# Patient Record
Sex: Female | Born: 1970 | Race: Black or African American | Marital: Single | Smoking: Current some day smoker
Health system: Southern US, Community
[De-identification: ages and names within clinical notes are randomized; demographics above are authoritative.]

## PROBLEM LIST (undated history)

## (undated) DIAGNOSIS — R079 Chest pain, unspecified: Secondary | ICD-10-CM

## (undated) DIAGNOSIS — R0602 Shortness of breath: Secondary | ICD-10-CM

## (undated) DIAGNOSIS — G8929 Other chronic pain: Secondary | ICD-10-CM

## (undated) DIAGNOSIS — I10 Essential (primary) hypertension: Secondary | ICD-10-CM

## (undated) DIAGNOSIS — IMO0002 Reserved for concepts with insufficient information to code with codable children: Secondary | ICD-10-CM

## (undated) DIAGNOSIS — D649 Anemia, unspecified: Secondary | ICD-10-CM

## (undated) DIAGNOSIS — M549 Dorsalgia, unspecified: Secondary | ICD-10-CM

## (undated) DIAGNOSIS — R87619 Unspecified abnormal cytological findings in specimens from cervix uteri: Secondary | ICD-10-CM

## (undated) HISTORY — PX: FOOT SURGERY: SHX648

## (undated) HISTORY — DX: Chest pain, unspecified: R07.9

## (undated) HISTORY — PX: TUBAL LIGATION: SHX77

## (undated) HISTORY — DX: Other chronic pain: G89.29

## (undated) HISTORY — DX: Shortness of breath: R06.02

## (undated) HISTORY — DX: Essential (primary) hypertension: I10

## (undated) HISTORY — DX: Dorsalgia, unspecified: M54.9

---

## 2000-09-15 ENCOUNTER — Encounter: Payer: Self-pay | Admitting: Emergency Medicine

## 2000-09-15 ENCOUNTER — Emergency Department (HOSPITAL_COMMUNITY): Admission: EM | Admit: 2000-09-15 | Discharge: 2000-09-15 | Payer: Self-pay | Admitting: Emergency Medicine

## 2000-09-18 ENCOUNTER — Encounter: Payer: Self-pay | Admitting: Emergency Medicine

## 2000-09-18 ENCOUNTER — Inpatient Hospital Stay (HOSPITAL_COMMUNITY): Admission: EM | Admit: 2000-09-18 | Discharge: 2000-09-20 | Payer: Self-pay | Admitting: Emergency Medicine

## 2001-05-29 ENCOUNTER — Emergency Department (HOSPITAL_COMMUNITY): Admission: EM | Admit: 2001-05-29 | Discharge: 2001-05-29 | Payer: Self-pay

## 2002-01-01 ENCOUNTER — Inpatient Hospital Stay (HOSPITAL_COMMUNITY): Admission: AD | Admit: 2002-01-01 | Discharge: 2002-01-01 | Payer: Self-pay | Admitting: *Deleted

## 2002-03-21 ENCOUNTER — Ambulatory Visit (HOSPITAL_COMMUNITY): Admission: RE | Admit: 2002-03-21 | Discharge: 2002-03-21 | Payer: Self-pay | Admitting: Obstetrics and Gynecology

## 2002-04-15 ENCOUNTER — Inpatient Hospital Stay (HOSPITAL_COMMUNITY): Admission: AD | Admit: 2002-04-15 | Discharge: 2002-04-15 | Payer: Self-pay | Admitting: Obstetrics and Gynecology

## 2002-06-26 ENCOUNTER — Inpatient Hospital Stay (HOSPITAL_COMMUNITY): Admission: AD | Admit: 2002-06-26 | Discharge: 2002-06-29 | Payer: Self-pay | Admitting: *Deleted

## 2002-06-26 ENCOUNTER — Encounter (INDEPENDENT_AMBULATORY_CARE_PROVIDER_SITE_OTHER): Payer: Self-pay

## 2003-04-05 ENCOUNTER — Emergency Department (HOSPITAL_COMMUNITY): Admission: AD | Admit: 2003-04-05 | Discharge: 2003-04-05 | Payer: Self-pay

## 2003-07-16 ENCOUNTER — Emergency Department (HOSPITAL_COMMUNITY): Admission: EM | Admit: 2003-07-16 | Discharge: 2003-07-16 | Payer: Self-pay | Admitting: Emergency Medicine

## 2005-02-17 ENCOUNTER — Emergency Department (HOSPITAL_COMMUNITY): Admission: EM | Admit: 2005-02-17 | Discharge: 2005-02-17 | Payer: Self-pay | Admitting: Family Medicine

## 2005-02-17 ENCOUNTER — Inpatient Hospital Stay: Admission: AD | Admit: 2005-02-17 | Discharge: 2005-02-17 | Payer: Self-pay | Admitting: Family Medicine

## 2005-10-31 ENCOUNTER — Emergency Department (HOSPITAL_COMMUNITY): Admission: EM | Admit: 2005-10-31 | Discharge: 2005-10-31 | Payer: Self-pay | Admitting: Family Medicine

## 2006-01-21 ENCOUNTER — Emergency Department (HOSPITAL_COMMUNITY): Admission: EM | Admit: 2006-01-21 | Discharge: 2006-01-21 | Payer: Self-pay | Admitting: Pediatrics

## 2006-05-01 ENCOUNTER — Emergency Department (HOSPITAL_COMMUNITY): Admission: EM | Admit: 2006-05-01 | Discharge: 2006-05-01 | Payer: Self-pay | Admitting: Emergency Medicine

## 2006-05-26 ENCOUNTER — Emergency Department (HOSPITAL_COMMUNITY): Admission: EM | Admit: 2006-05-26 | Discharge: 2006-05-27 | Payer: Self-pay | Admitting: Emergency Medicine

## 2008-06-10 ENCOUNTER — Encounter (INDEPENDENT_AMBULATORY_CARE_PROVIDER_SITE_OTHER): Payer: Self-pay | Admitting: Nurse Practitioner

## 2009-09-23 ENCOUNTER — Emergency Department (HOSPITAL_COMMUNITY): Admission: EM | Admit: 2009-09-23 | Discharge: 2009-09-23 | Payer: Self-pay | Admitting: Emergency Medicine

## 2010-07-14 ENCOUNTER — Ambulatory Visit: Payer: Self-pay | Admitting: Gynecology

## 2010-07-14 ENCOUNTER — Inpatient Hospital Stay (HOSPITAL_COMMUNITY): Admission: AD | Admit: 2010-07-14 | Discharge: 2010-07-14 | Payer: Self-pay | Admitting: Obstetrics and Gynecology

## 2010-09-29 ENCOUNTER — Ambulatory Visit: Payer: Self-pay | Admitting: Obstetrics and Gynecology

## 2011-01-06 LAB — GC/CHLAMYDIA PROBE AMP, GENITAL: GC Probe Amp, Genital: NEGATIVE

## 2011-01-06 LAB — URINALYSIS, ROUTINE W REFLEX MICROSCOPIC
Glucose, UA: NEGATIVE mg/dL
Specific Gravity, Urine: 1.02 (ref 1.005–1.030)
pH: 6 (ref 5.0–8.0)

## 2011-01-06 LAB — CBC
Hemoglobin: 12.9 g/dL (ref 12.0–15.0)
MCH: 31.9 pg (ref 26.0–34.0)
MCHC: 33.6 g/dL (ref 30.0–36.0)
Platelets: 261 10*3/uL (ref 150–400)
RDW: 13.9 % (ref 11.5–15.5)

## 2011-01-06 LAB — URINE MICROSCOPIC-ADD ON

## 2011-01-06 LAB — POCT PREGNANCY, URINE: Preg Test, Ur: NEGATIVE

## 2011-01-06 LAB — WET PREP, GENITAL: Clue Cells Wet Prep HPF POC: NONE SEEN

## 2011-03-11 NOTE — H&P (Signed)
NAME:  Jamie Klein, Jamie Klein                          ACCOUNT NO.:  [id number]   MEDICAL RECORD NO.:  [id number]                   PATIENT TYPE:  INP   LOCATION:  NA                                   FACILITY:  [facility]   PHYSICIAN:  Reva Bores, MD                   DATE OF BIRTH:  1971-06-04   DATE OF ADMISSION:  06/26/2002  DATE OF DISCHARGE:                                HISTORY & PHYSICAL   IDENTIFYING INFORMATION:  This is a History and Physical dictated for an  operation.  She is scheduled for a repeat C-section with BTL on June 26, 2002.   CHIEF COMPLAINT:  Intrauterine pregnancy at 39 weeks and previous C-  sections.   HISTORY OF PRESENT ILLNESS:  The patient is a 40 year old gravida 7, para 3-  0-3-2 who is at 39-0/7 weeks on the day of surgery who is dated by a 25-week  ultrasound that agreed with her LMP.  She has a history of previous C-  section x2 and drug use.   PAST MEDICAL HISTORY:  Significant for history of addiction since age 32  years.  She has had problems with crack cocaine, alcohol as well as tobacco  use.   PAST SURGICAL HISTORY:  She has had prior cesarean section x2 as well as  wisdom teeth extraction.   PAST GYNECOLOGICAL HISTORY:  The patient has a history of Trichomonas and  candidiasis.  She has a history of an abnormal Pap in 1989 and none since  then.   PAST OBSTETRICAL HISTORY:  She has a history of a vaginal delivery of a 6  pound 1 ounce female without complications in 1989.  In 1991, another female  7 pounds 3 ounces delivered by C-section for fetal distress.  This baby is  deceased.  In 12-22-91, she had a female 5 pound 3 ounce delivered by  C-section and three previous therapeutic ABs.  Her current pregnancy has  been uncomplicated thus far.   PERTINENT LABORATORY AND X-RAY DATA:  Include initial hemoglobin of 10.9,  platelet count of 299.  Her blood type is 0 positive.  Her antibody screen  is negative.  Her RPR is nonreactive.   Hepatitis B surface antigen is  negative.  She is rubella immune.  She had a normal hemoglobin  electrophoresis.  Her HIV is negative.  Gonorrhea and Chlamydia cultures are  also negative.  Her Pap smear is negative.  Her one-hour was 107 and her  Group B strep was positive.   PHYSICAL EXAMINATION:  GENERAL:  The patient is a well-developed, well-  nourished gravid female in no acute distress.  HEENT:  Normocephalic, atraumatic.  Pupils equal, round, reactive to light  and accommodation.  Extraocular muscles intact.  She has poor dentition.  NECK:  Supple with normal thyroid.  BREASTS:  Normal with inverted nipples and without mass.  CARDIOVASCULAR:  Regular rate and rhythm with a 2/6 systolic ejection murmur  heard.  LUNGS:  Clear bilaterally.  ABDOMEN:  Gravid and appropriately grown fetus.  Nontender.  EXTREMITIES:  Without clubbing, cyanosis, or edema.  PELVIC:  Vaginal exam is not being performed at this time.    IMPRESSION:  52. A 40 year old G7, P3-0-3-2 at 39-0/7 weeks.  2. History of drug abuse currently in drug rehab.  3. Group B strep postive.  4. Previous C-sections x2.  5. Undesired fertility.   PLAN:  Repeat low transverse cesarean section and BTL.   DISCUSSION:  The patient and husband counseled about risks and benefits of  repeat C-section versus trial of labor as well as the permanency of  sterilization including risk of pregnancy, risk of ectopic permanency of  procedure, and inability to reverse this procedure. The patient understands  all of these risks and agrees to proceed with sterilization.  She is also  advised about the risks of repeat C-section including risks of bleeding,  infection, maternal death, injury to bowel and bladder or surrounding  structures.  The patient understands all of these risks and agrees to  proceed with a repeat C-section.                                               Reva Bores, MD    TSP/MEDQ  D:  06/14/2002  T:   06/14/2002  Job:  [id number]

## 2011-03-11 NOTE — Discharge Summary (Signed)
[place]. [facility]  Patient:    Jamie Klein, Jamie Klein                       MRN: [id number] Adm. Date:  [id number] Disc. Date: 09/20/00 Attending:  Effie Berkshire                           Discharge Summary  ADMISSION DIAGNOSIS:  Cellulitis of left parapharyngeal and left submandibular space secondary to abscess tooth number 17.  DISCHARGE DIAGNOSIS: Cellulitis of left parapharyngeal and left submandibular space secondary to abscess tooth number 17.  BRIEF HISTORY:  This reveals a 40 year old black female who had reported early on Monday morning to the emergency room at [facility] suffering with swelling and pain to the left side of her neck.  By history, the patient relates that she had experienced swelling approximately five to six days previously and in the last three days had been unable to eat any solid foods. At the time of her initial examination, she demonstrated moderate swelling of the left submandibular region and changes to the character of her voice.  Oral examination revealed severe trismus, but I could identify that the left parapharyngeal space was very swollen.  There was deviation of the uvula to the right side, and gentle palpation over partially erupted tooth number 17 elicited extreme discomfort.  The patient stated that tooth number 17 had been painful to her for a few days, and you could see obvious decay in the occlusal surface of this tooth.  At this time, the patient was admitted for treatment with intravenous antibiotics and surgical incision and drainage along with the removal of tooth number 17.  PERTINENT LABORATORY DATA ON ADMISSION:  WBC 20.8, RBC 4.16, hemoglobin 12.9, hematocrit 36.6.  Differential revealed neutrophils of 86 and neutrophil absolute of 17.9, lymphocytes 7, monocytes 1.0.  It was also noted that her glucose was slightly elevated at 128.  A urinalysis revealed a cloudy appearance with a specific  gravity of 1.04.  There were traces of hemoglobin and small amount of bilirubin seen in this urinalysis, and it was also positive for nitrite.  Many bacteria cells were also seen, and WBC casts were also noted.  At the time of admission, a chest x-ray was obtained which revealed the heart size and mediastinal contours were unremarkable, and the lungs were clear.  The Panorex radiograph revealed large caries seen involving the right mandibular first molar, but there was no evidence of periapical abscess.  In my review of this x-ray, I could also demonstrate some caries of partially erupted tooth number 17 and also a slight radioluscent change at the apex of tooth number 17.  In addition a neck and head and neck CT were obtained, and there was seen a large complex lesion containing internal gas seen in the left parapharyngeal soft tissues which extends from the level of the skull base to the angle of the mandible.  The submandibular gland is displaced inferiorly by the left parapharyngeal abscess and associated soft tissue swelling.  Adenopathy is seen along the left carotid and jugular chains with the largest node measuring approximately 1.2 to 2.0 cm in the jugular digastric area.  HOSPITAL COURSE:  At the time of admission, the patients temperature was 101, pulse 95 and regular, respirations 18 and unlabored, blood pressure 110/41.  Surgical treatment for this patient consisted of incision and drainage of the left  submandibular and left parapharyngeal spaces along with the surgical removal of tooth number 17.  This procedure was completed on the evening of September 18, 2000, under general anesthesia.  At the time of incision and drainage of the left parapharyngeal space, significant amounts of purulence were obtained, and this was sent for culture and sensitivity.  At the time of discharge, the results received thus far indicated from the Gram smear, moderate WBC present  predominantly PMNs, moderate gram-positive cocci.  Later, the anaerobic culture revealed some gram-negative rods and a few gram-positive rods.  At the time of discharge, the patient was maintaining a full liquid diet without difficulty and had no problems with respiration.  Her discharge condition was ambulatory and satisfactory.  It was noted that she still has some swelling at the left submandibular and left parapharyngeal spaces.  I informed her that it may take as much as two weeks before this swelling is completely resolved.  DISCHARGE INSTRUCTIONS AND DISCHARGE MEDICATIONS: 1. Activity as tolerated. 2. Full liquid diet. 3. a. Prescription for Augmentin 875 mg to be taken on a b.i.d. basis for       a period of 10 days.    b. Prescription for Lortab liquid to be taken 1 tablespoon every 3 to       4 hours as needed for discomfort.    c. Prescription for Perigard mouth rinse to use to swish 1/2 ounce for       30 seconds and then expectorate twice a day.  Discharge instructions were given to the patient, and she is to call my office for postoperative appointment on December 3 or December 4.DD:  09/20/00 TD:  09/20/00 Job: [id number]

## 2011-03-11 NOTE — Op Note (Signed)
[place]. [facility]  Patient:    Jamie Klein, Jamie Klein                       MRN: [id number] Proc. Date: 09/18/00 Adm. Date:  [id number] Attending:  Effie Berkshire                           Operative Report  PREOPERATIVE DIAGNOSIS:  Facial cellulitis of the left submandibular and left parapharyngeal spaces secondary to infected tooth number 17.  POSTOPERATIVE DIAGNOSIS:  Facial cellulitis of the left submandibular and left parapharyngeal spaces secondary to infected tooth number 17.  SURGEON:  Saddie Benders, D.D.S.  SURGICAL PROCEDURE:  Surgical removal of impacted tooth number 17 and extraoral incision and drainage of the left submandibular and left parapharyngeal spaces.  DETAILS OF SURGICAL PROCEDURE:  On the evening of September 18, 2000, this patient was brought to the operating room, where after obtaining the proper level of anesthesia with the use of orotracheal intubation, the patient received an intraoral and extraoral Betadine prep.  A 2 inch wet vaginal gauze packing was placed in the oropharynx and sterile drapes were used to isolate the surgical field.  First attention was directed to impacted tooth number 17, where a total of 1.8 cc of 0.5% Marcaine with 1:200,000 epinephrine were utilized for local anesthesia.  A #15-blade was used to make a mucoperiosteal flap overlying the impacted tooth and a Stryker drill with a #8 round bur was used to remove some of the buccal cortical plate.  This tooth was then extracted from its respective socket site.  The bony socket was smoothed with a rongeur and a bone file.  The area was thoroughly irrigated and suctioned and tissue reapproximated with #4-0 chromic suture in an interrupted fashion. Attention was now directed to the left submandibular region, where a #15-blade was used to make an incision approximately 2.5 cm long approximately 1.5 to 2 fingers width below the left angle of the  mandible.  This incision was carried through the skin and subcutaneous tissues.  At this point, a large curved hemostat was then used to begin blunt dissection down to the left inferior border of the mandible just anterior to the angle.  While placing one finger inside the mouth in the left parapharyngeal space, this blunt dissection was carried out medial to the mandible and once it dropped into the left parapharyngeal space, copious amounts of pus was obtained.  This was sent for culture and sensitivity for aerobic, anaerobic and Gram stain.  The hemostat was explored around in this space to continue drainage as much as possible. Then, the hemostat was redirected more anterior and medial into the left submandibular space.  Again, I was able to palpate the space with my left hand to determine that the hemostat was in the correct position.  No actual pus was encountered in the left submandibular space.  At this time, a 1/4 inch Penrose drain was placed into the left submandibular space and the second one through the same incision and drainage site was placed into the left parapharyngeal space.  Both of these drains were then secured with one #4-0 black silk suture.   The 2 inch wet vaginal gauze packing was removed from the oropharynx and this patient was then transferred to the recovery room in stable condition.  TIME OF PROCEDURE:  45 minutes.  ESTIMATED BLOOD LOSS:  Minimal.  ANESTHESIA:  General.  COMPLICATIONS:  Without.  CONDITION OF PATIENT WHEN LAST EXAMINED:  Deemed satisfactory. DD:  09/18/00 TD:  09/18/00 Job: [id number]

## 2011-03-11 NOTE — Discharge Summary (Signed)
NAME:  Jamie Klein                             ACCOUNT NO.:  [id number]   MEDICAL RECORD NO.:  [id number]                   PATIENT TYPE:   LOCATION:                                       FACILITY:   PHYSICIAN:  Caren Griffins, C.N.M.                 DATE OF BIRTH:   DATE OF ADMISSION:  06/26/2002  DATE OF DISCHARGE:  06/29/2002                                 DISCHARGE SUMMARY   ADMISSION DIAGNOSES:  1. Term intrauterine pregnancy.  2. Desirous of repeat cesarean section.  3. Multiparity, desirous of tubal ligation.   DISCHARGE DIAGNOSES:  1. Cesarean delivery of a viable female at term.  2. Bilateral tubal ligation.   HISTORY:  This is a 40 year old G7 P3-0-3-2 who presented at 41 and 0  sevenths scheduled for surgery without labor.  She had received counseling  about trial of labor and declined it.  Her first cesarean was due to fetal  distress and her second was a failed trial of labor; both were ATA.  Also of  note she has a significant substance abuse history.  Her dating was good.  She had a 25-week ultrasound confirming an LMP that she was sure of.  Pregnancy course was essentially uncomplicated.  She started care late.  Her  glucose screen was 107.  She was O positive, antibody screen negative,  rubella immune, and had no STIs.   ALLERGIES:  None.   MEDICATIONS:  Prenatal vitamins.   GYNECOLOGICAL HISTORY:  Significant for trichomonas and candidiasis pre-  pregnancy.  She had an abnormal Pap smear in 1989 and had not had another  Pap smear until May 2003 which was done at her new OB visit and was  negative.   OBSTETRICAL HISTORY:  As above.  Of note, she also had three therapeutic ABs  that were all early and uncomplicated.   PERTINENT PRENATAL LABORATORY DATA:  Hemoglobin 10.9, platelets 299.  GC and  chlamydia were negative.  GBS was positive.   FAMILY HISTORY:  Noncontributory.   SOCIAL HISTORY:  She has had significant addiction since the age of 73  including problems with crack cocaine, alcohol, and tobacco use - smoking  about a pack a day during this pregnancy.  UDS was not done on admission.   ADMISSION PHYSICAL EXAMINATION:  GENERAL:  She is in no distress.  VITAL SIGNS:  Blood pressure was normal; she was afebrile.  HEENT:  WNL.  NECK:  Supple without thyroid enlargement.  BREASTS:  Normal, no masses.  CARDIOVASCULAR:  RRR with a grade 2/6 systolic ejection murmur.  LUNGS:  Clear bilaterally.  ABDOMEN:  Term-sized, gravid, with no tenderness.  EXTREMITIES:  Without edema.  PELVIC:  Deferred.   HOSPITAL COURSE:  The patient was admitted to the OR for a scheduled repeat  low transverse cesarean section with BTL.  She was counseled on risks and  benefits and  trial of labor again and the permanency of sterilization and  understood all of this.  The surgeon was Dr. Orlene Erm and Dr. Shawnie Pons.  There  were no operative complications.  Bilateral tube segments were sent to  pathology.  EBL was 800 cc.  Her postoperative course was essentially  uncomplicated.  She apparently was seen by the social worker during her  hospitalization.  It was found that she was going to be placed for her  living arrangement at [facility], which is a two-year recovery program.  Of note, the baby's urine drug screen was negative.  CPS is involved.  She  does not have custody of her other children.  Physically, she recovered  normally postpartum.  On day #3 she was ambulatory without orthostatic  symptoms, voiding QS, tolerating a regular diet, breast feeding, and had no  complaints other than after pains.  She is afebrile, vital signs stable.  Hemoglobin on discharge was 10.1, hematocrit 29.4.  Abdomen was minimally  tender and uterus was involuting.  Incision CDI with staples.   MEDICATIONS:  She was discharged to [facility] on the following  medications:  1. Ibuprofen 600 p.o. q.6h.  2. Prenatal vitamins one a day.  3. Iron supplement one a day.    DISPOSITION:  She was to return in two days to have staples removed and  otherwise in six weeks to [facility] for postpartum visit.  She was  given the routine instructions and was in good condition on discharge.                                               Caren Griffins, C.N.M.    DP/MEDQ  D:  09/10/2002  T:  09/10/2002  Job:  [id number]

## 2011-05-12 ENCOUNTER — Inpatient Hospital Stay (HOSPITAL_COMMUNITY)
Admission: AD | Admit: 2011-05-12 | Discharge: 2011-05-12 | Disposition: A | Discharge status: Home / Self Care | Payer: Medicaid Other | Source: Ambulatory Visit | Attending: Family Medicine | Admitting: Family Medicine

## 2011-05-12 ENCOUNTER — Encounter (HOSPITAL_COMMUNITY): Payer: Self-pay | Admitting: Advanced Practice Midwife

## 2011-05-12 DIAGNOSIS — N390 Urinary tract infection, site not specified: Secondary | ICD-10-CM | POA: Insufficient documentation

## 2011-05-12 DIAGNOSIS — N920 Excessive and frequent menstruation with regular cycle: Secondary | ICD-10-CM

## 2011-05-12 DIAGNOSIS — F1911 Other psychoactive substance abuse, in remission: Secondary | ICD-10-CM | POA: Insufficient documentation

## 2011-05-12 DIAGNOSIS — Z98891 History of uterine scar from previous surgery: Secondary | ICD-10-CM | POA: Insufficient documentation

## 2011-05-12 DIAGNOSIS — Z8619 Personal history of other infectious and parasitic diseases: Secondary | ICD-10-CM | POA: Insufficient documentation

## 2011-05-12 DIAGNOSIS — J020 Streptococcal pharyngitis: Secondary | ICD-10-CM

## 2011-05-12 HISTORY — DX: Reserved for concepts with insufficient information to code with codable children: IMO0002

## 2011-05-12 HISTORY — DX: Anemia, unspecified: D64.9

## 2011-05-12 HISTORY — DX: Unspecified abnormal cytological findings in specimens from cervix uteri: R87.619

## 2011-05-12 LAB — URINALYSIS, ROUTINE W REFLEX MICROSCOPIC
Glucose, UA: NEGATIVE mg/dL
Leukocytes, UA: NEGATIVE
Protein, ur: NEGATIVE mg/dL
Specific Gravity, Urine: >1.03 — ABNORMAL HIGH (ref 1.005–1.030)

## 2011-05-12 LAB — WET PREP, GENITAL
Trich, Wet Prep: NONE SEEN
Yeast Wet Prep HPF POC: NONE SEEN

## 2011-05-12 LAB — DIFFERENTIAL
Basophils Absolute: 0 10*3/uL (ref 0.0–0.1)
Lymphocytes Relative: 21 % (ref 12–46)
Monocytes Absolute: 1 10*3/uL (ref 0.1–1.0)
Neutro Abs: 8.5 10*3/uL — ABNORMAL HIGH (ref 1.7–7.7)

## 2011-05-12 LAB — CBC
HCT: 34.6 % — ABNORMAL LOW (ref 36.0–46.0)
Platelets: 239 10*3/uL (ref 150–400)
RBC: 3.9 MIL/uL (ref 3.87–5.11)
RDW: 13.9 % (ref 11.5–15.5)
WBC: 12 10*3/uL — ABNORMAL HIGH (ref 4.0–10.5)

## 2011-05-12 LAB — POCT PREGNANCY, URINE
Preg Test, Ur: NEGATIVE
Preg Test, Ur: NEGATIVE

## 2011-05-12 LAB — URINE MICROSCOPIC-ADD ON

## 2011-05-12 MED ORDER — CEFTRIAXONE SODIUM 1 G IJ SOLR
1.0000 g | Freq: Once | INTRAMUSCULAR | Status: AC
  Administered 2011-05-12: 1 g via INTRAMUSCULAR
  Filled 2011-05-12: qty 1

## 2011-05-12 MED ORDER — AMOXICILLIN 875 MG PO TABS: 875.0000 mg | ORAL_TABLET | Freq: Two times a day (BID) | ORAL | Status: AC

## 2011-05-12 MED ORDER — ACETAMINOPHEN 500 MG PO TABS
1000.0000 mg | ORAL_TABLET | Freq: Once | ORAL | Status: AC
  Administered 2011-05-12: 1000 mg via ORAL
  Filled 2011-05-12: qty 2

## 2011-05-12 NOTE — Progress Notes (Signed)
Vaginal bleeding since 07/08 gets heavy and slacks off, tubal ligation

## 2011-05-12 NOTE — ED Provider Notes (Addendum)
History    The pt is a 40 year-old female who presents to [facility]U reporting vaginal bleeding since 05/01/11 as heavy as the middle of a period. The denies any previous episodes of prolonged bleeding. She is a poor historian and does not know when her last Pap was. Last IC several months ago. She also reports having chills, mod sore throat, mild cough and mild nausea since this morning. She denies UTI Sx. Chief Complaint  Patient presents with  . Vaginal Bleeding  . Sore Throat    onset today   HPI  Pertinent Gynecological History: Menses: flow is moderate, regular every month without intermenstrual spotting and usually lasting less than 6 days Bleeding: dysfunctional uterine bleeding Contraception: tubal ligation Sexually transmitted diseases: past history: Trich Previous GYN Procedures: unknown  Last mammogram: unknown  Last pap: unknown Date: unknown     Past Medical History  Diagnosis Date  . Abnormal Pap smear   . Anemia    . Past Surgical History  Procedure Date  . Cesarean section     x3  . Tubal ligation     No family history on file.  History  Substance Use Topics  . Smoking status: Not on file  . Smokeless tobacco: Not on file  . Alcohol Use:     Allergies: No Known Allergies  Prescriptions prior to admission  Medication Sig Dispense Refill  . traMADol (ULTRAM) 50 MG tablet Take 50 mg by mouth every 6 (six) hours as needed. For pain         Review of Systems  Constitutional: Positive for chills.  HENT: Positive for congestion and sore throat. Negative for ear pain.   Respiratory: Positive for cough (mild cough x 1 day).   Gastrointestinal: Positive for nausea. Negative for vomiting, abdominal pain, diarrhea and constipation.  Genitourinary: Negative for dysuria, urgency, frequency, hematuria and flank pain.  Musculoskeletal: Negative for myalgias.   Physical Exam   Blood pressure 100/57, pulse 96, temperature 100.6 F (38.1 C), temperature source Oral,  resp. rate 18, height 5\' 3"  (1.6 m), weight 66.679 kg (147 lb), last menstrual period 05/01/2011.  Physical Exam  Constitutional: She appears well-developed and well-nourished. No distress.  HENT:  Mouth/Throat: Oropharynx is clear and moist.  Cardiovascular: Regular rhythm.  Tachycardia present.   Respiratory: Effort normal and breath sounds normal.  GI: Soft. Bowel sounds are normal. There is no tenderness. There is no CVA tenderness.  Genitourinary: Uterus normal. Cervix exhibits no motion tenderness and no friability. Right adnexum displays no mass, no tenderness and no fullness. Left adnexum displays no mass, no tenderness and no fullness. There is bleeding (small amount of DRB, normal odor) around the vagina.   Results for orders placed during the hospital encounter of 05/12/11 (from the past 24 hour(s))  RAPID STREP SCREEN     Status: Abnormal   Collection Time   05/12/11  6:00 PM      Component Value Range   Streptococcus, Group A Screen (Direct) POSITIVE (*) NEGATIVE   URINALYSIS, ROUTINE W REFLEX MICROSCOPIC     Status: Abnormal   Collection Time   05/12/11  6:10 PM      Component Value Range   Color, Urine YELLOW  YELLOW    Appearance CLEAR  CLEAR    Specific Gravity, Urine >1.030 (*) 1.005 - 1.030    pH 5.5  5.0 - 8.0    Glucose, UA NEGATIVE  NEGATIVE (mg/dL)   Hgb urine dipstick LARGE (*) NEGATIVE  Bilirubin Urine NEGATIVE  NEGATIVE    Ketones, ur 15 (*) NEGATIVE (mg/dL)   Protein, ur NEGATIVE  NEGATIVE (mg/dL)   Urobilinogen, UA 0.2  0.0 - 1.0 (mg/dL)   Nitrite POSITIVE (*) NEGATIVE    Leukocytes, UA NEGATIVE  NEGATIVE   URINE MICROSCOPIC-ADD ON     Status: Abnormal   Collection Time   05/12/11  6:10 PM      Component Value Range   Squamous Epithelial / LPF MANY (*) RARE    WBC, UA 3-6  <3 (WBC/hpf)   RBC / HPF 0-2  <3 (RBC/hpf)   Bacteria, UA MANY (*) RARE    Urine-Other MUCOUS PRESENT    POCT PREGNANCY, URINE     Status: Normal   Collection Time   05/12/11   6:13 PM      Component Value Range   Preg Test, Ur NEGATIVE    WET PREP, GENITAL     Status: Abnormal   Collection Time   05/12/11  6:15 PM      Component Value Range   Yeast, Wet Prep NONE SEEN  NONE SEEN    Trich, Wet Prep NONE SEEN  NONE SEEN    Clue Cells, Wet Prep NONE SEEN  NONE SEEN    WBC, Wet Prep HPF POC FEW (*) NONE SEEN   CBC     Status: Abnormal   Collection Time   05/12/11  7:46 PM      Component Value Range   WBC 12.0 (*) 4.0 - 10.5 (K/uL)   RBC 3.90  3.87 - 5.11 (MIL/uL)   Hemoglobin 11.6 (*) 12.0 - 15.0 (g/dL)   HCT 16.1 (*) 09.6 - 46.0 (%)   MCV 88.7  78.0 - 100.0 (fL)   MCH 29.7  26.0 - 34.0 (pg)   MCHC 33.5  30.0 - 36.0 (g/dL)   RDW 04.5  40.9 - 81.1 (%)   Platelets 239  150 - 400 (K/uL)  DIFFERENTIAL     Status: Abnormal   Collection Time   05/12/11  7:46 PM      Component Value Range   Neutrophils Relative 70  43 - 77 (%)   Neutro Abs 8.5 (*) 1.7 - 7.7 (K/uL)   Lymphocytes Relative 21  12 - 46 (%)   Lymphs Abs 2.5  0.7 - 4.0 (K/uL)   Monocytes Relative 8  3 - 12 (%)   Monocytes Absolute 1.0  0.1 - 1.0 (K/uL)   Eosinophils Relative 0  0 - 5 (%)   Eosinophils Absolute 0.0  0.0 - 0.7 (K/uL)   Basophils Relative 0  0 - 1 (%)   Basophils Absolute 0.0  0.0 - 0.1 (K/uL)  POCT PREGNANCY, URINE     Status: Normal   Collection Time   05/12/11  8:01 PM      Component Value Range   Preg Test, Ur NEGATIVE      MAU Course  Procedures  Assessment and Plan  Assessment: 1. Strep throat 2. UTI w// no evidence of pyelonephritis 3. Menorrhagia, stable  Plan: 1. Rocephin 1g IM x 1 now, then amoxicillin 875 PO BID x 7 days 2. Urine culture 3. Pyelo precautions 4. Pap ASAP 5. F/U  HD for menorrhagia or MAU for heavy bleeding or dizziness   SMITH,[place] 05/12/2011, 8:32 PM    Urine culture showed >100,000 e. Coli, pansensitive except to ampicillin, pt treated with rochephin at MAU visit, no further tx required  Azavion Bouillon 8:11 AM 05/17/11

## 2011-05-13 LAB — GC/CHLAMYDIA PROBE AMP, GENITAL: GC Probe Amp, Genital: NEGATIVE

## 2011-05-15 LAB — URINE CULTURE

## 2011-05-17 NOTE — Progress Notes (Signed)
Encounter addended by: Georges Mouse, CNM on: 05/17/2011  8:11 AM<BR>     Documentation filed: Charting, Inpatient Notes

## 2011-05-23 NOTE — ED Provider Notes (Signed)
Agree with above note.  Jamie Klein 05/23/2011 2:16 PM

## 2011-08-01 ENCOUNTER — Other Ambulatory Visit (HOSPITAL_COMMUNITY): Payer: Self-pay | Admitting: Internal Medicine

## 2011-08-01 DIAGNOSIS — Z1231 Encounter for screening mammogram for malignant neoplasm of breast: Secondary | ICD-10-CM

## 2011-08-12 ENCOUNTER — Ambulatory Visit (HOSPITAL_COMMUNITY): Payer: Medicaid Other

## 2011-09-02 ENCOUNTER — Ambulatory Visit (HOSPITAL_COMMUNITY)
Admission: RE | Admit: 2011-09-02 | Discharge: 2011-09-02 | Disposition: A | Discharge status: Home / Self Care | Payer: Medicaid Other | Source: Ambulatory Visit | Attending: Internal Medicine | Admitting: Internal Medicine

## 2011-09-02 DIAGNOSIS — Z1231 Encounter for screening mammogram for malignant neoplasm of breast: Secondary | ICD-10-CM | POA: Insufficient documentation

## 2012-08-22 ENCOUNTER — Ambulatory Visit
Admission: RE | Admit: 2012-08-22 | Discharge: 2012-08-22 | Disposition: A | Discharge status: Home / Self Care | Payer: Medicaid Other | Source: Ambulatory Visit | Attending: Specialist | Admitting: Specialist

## 2012-08-22 ENCOUNTER — Other Ambulatory Visit: Payer: Self-pay | Admitting: Specialist

## 2012-08-22 DIAGNOSIS — M549 Dorsalgia, unspecified: Secondary | ICD-10-CM

## 2012-09-12 ENCOUNTER — Other Ambulatory Visit (HOSPITAL_COMMUNITY): Payer: Self-pay | Admitting: Specialist

## 2012-09-12 DIAGNOSIS — Z1231 Encounter for screening mammogram for malignant neoplasm of breast: Secondary | ICD-10-CM

## 2012-10-03 ENCOUNTER — Ambulatory Visit (HOSPITAL_COMMUNITY): Payer: Medicaid Other

## 2012-10-08 ENCOUNTER — Other Ambulatory Visit (HOSPITAL_COMMUNITY): Payer: Self-pay | Admitting: Cardiovascular Disease

## 2012-10-08 DIAGNOSIS — R072 Precordial pain: Secondary | ICD-10-CM

## 2012-10-19 ENCOUNTER — Ambulatory Visit (HOSPITAL_COMMUNITY): Payer: Medicaid Other

## 2012-10-23 ENCOUNTER — Ambulatory Visit (HOSPITAL_COMMUNITY): Payer: Medicaid Other

## 2012-10-25 ENCOUNTER — Encounter (HOSPITAL_COMMUNITY): Payer: Medicaid Other

## 2012-11-14 ENCOUNTER — Ambulatory Visit (HOSPITAL_COMMUNITY): Payer: Medicaid Other

## 2012-11-15 ENCOUNTER — Ambulatory Visit (HOSPITAL_COMMUNITY)
Admission: RE | Admit: 2012-11-15 | Discharge: 2012-11-15 | Disposition: A | Discharge status: Home / Self Care | Payer: Medicaid Other | Source: Ambulatory Visit | Attending: Cardiovascular Disease | Admitting: Cardiovascular Disease

## 2012-11-15 DIAGNOSIS — F17200 Nicotine dependence, unspecified, uncomplicated: Secondary | ICD-10-CM | POA: Insufficient documentation

## 2012-11-15 DIAGNOSIS — R072 Precordial pain: Secondary | ICD-10-CM

## 2012-11-15 DIAGNOSIS — R079 Chest pain, unspecified: Secondary | ICD-10-CM

## 2012-11-15 DIAGNOSIS — I059 Rheumatic mitral valve disease, unspecified: Secondary | ICD-10-CM | POA: Insufficient documentation

## 2012-11-15 DIAGNOSIS — I369 Nonrheumatic tricuspid valve disorder, unspecified: Secondary | ICD-10-CM | POA: Insufficient documentation

## 2012-11-15 HISTORY — DX: Chest pain, unspecified: R07.9

## 2012-11-15 NOTE — Progress Notes (Signed)
[facility]   2D echo completed 11/15/2012.   Cindy Dorell Gatlin, RDCS   

## 2012-11-23 ENCOUNTER — Ambulatory Visit (HOSPITAL_COMMUNITY)
Admission: RE | Admit: 2012-11-23 | Discharge: 2012-11-23 | Disposition: A | Discharge status: Home / Self Care | Payer: Medicaid Other | Source: Ambulatory Visit | Attending: Specialist | Admitting: Specialist

## 2012-11-23 DIAGNOSIS — Z1231 Encounter for screening mammogram for malignant neoplasm of breast: Secondary | ICD-10-CM | POA: Insufficient documentation

## 2012-11-27 ENCOUNTER — Other Ambulatory Visit: Payer: Self-pay | Admitting: Specialist

## 2012-11-27 DIAGNOSIS — R928 Other abnormal and inconclusive findings on diagnostic imaging of breast: Secondary | ICD-10-CM

## 2012-12-07 ENCOUNTER — Other Ambulatory Visit: Payer: Medicaid Other

## 2012-12-12 ENCOUNTER — Other Ambulatory Visit: Payer: Self-pay | Admitting: Internal Medicine

## 2012-12-12 ENCOUNTER — Ambulatory Visit
Admission: RE | Admit: 2012-12-12 | Discharge: 2012-12-12 | Disposition: A | Discharge status: Home / Self Care | Payer: Medicaid Other | Source: Ambulatory Visit | Attending: Specialist | Admitting: Specialist

## 2012-12-12 DIAGNOSIS — R928 Other abnormal and inconclusive findings on diagnostic imaging of breast: Secondary | ICD-10-CM

## 2013-02-23 ENCOUNTER — Encounter: Payer: Self-pay | Admitting: Cardiovascular Disease

## 2013-03-13 ENCOUNTER — Ambulatory Visit: Payer: Medicaid Other | Admitting: Cardiovascular Disease

## 2013-03-22 NOTE — Progress Notes (Signed)
Patient has appointment to discuss test results on Monday. Results to be given at that time.

## 2013-03-25 ENCOUNTER — Ambulatory Visit: Payer: Medicaid Other | Admitting: Cardiovascular Disease

## 2013-07-05 ENCOUNTER — Other Ambulatory Visit: Payer: Self-pay | Admitting: Internal Medicine

## 2013-07-05 ENCOUNTER — Other Ambulatory Visit (HOSPITAL_COMMUNITY): Payer: Self-pay | Admitting: Internal Medicine

## 2013-07-05 DIAGNOSIS — N6310 Unspecified lump in the right breast, unspecified quadrant: Secondary | ICD-10-CM

## 2013-07-05 DIAGNOSIS — Z1231 Encounter for screening mammogram for malignant neoplasm of breast: Secondary | ICD-10-CM

## 2013-07-11 ENCOUNTER — Other Ambulatory Visit: Payer: Medicaid Other

## 2013-07-18 ENCOUNTER — Other Ambulatory Visit: Payer: Medicaid Other

## 2013-12-12 ENCOUNTER — Ambulatory Visit: Payer: Medicaid Other | Admitting: Cardiovascular Disease

## 2013-12-23 ENCOUNTER — Other Ambulatory Visit: Payer: Self-pay | Admitting: Internal Medicine

## 2013-12-23 ENCOUNTER — Other Ambulatory Visit (HOSPITAL_COMMUNITY): Payer: Self-pay | Admitting: Internal Medicine

## 2013-12-23 ENCOUNTER — Ambulatory Visit (HOSPITAL_COMMUNITY)
Admission: RE | Admit: 2013-12-23 | Discharge: 2013-12-23 | Disposition: A | Discharge status: Home / Self Care | Payer: Medicaid Other | Source: Ambulatory Visit | Attending: Internal Medicine | Admitting: Internal Medicine

## 2013-12-23 DIAGNOSIS — D241 Benign neoplasm of right breast: Secondary | ICD-10-CM

## 2013-12-23 DIAGNOSIS — M25519 Pain in unspecified shoulder: Secondary | ICD-10-CM | POA: Insufficient documentation

## 2014-01-02 ENCOUNTER — Other Ambulatory Visit: Payer: Medicaid Other

## 2014-01-09 ENCOUNTER — Encounter: Payer: Self-pay | Admitting: Cardiovascular Disease

## 2014-01-09 ENCOUNTER — Ambulatory Visit (INDEPENDENT_AMBULATORY_CARE_PROVIDER_SITE_OTHER): Payer: Medicaid Other | Admitting: Cardiovascular Disease

## 2014-01-09 VITALS — BP 112/70 | HR 85 | Ht 63.0 in | Wt 178.8 lb

## 2014-01-09 DIAGNOSIS — M25519 Pain in unspecified shoulder: Secondary | ICD-10-CM

## 2014-01-09 DIAGNOSIS — Z87891 Personal history of nicotine dependence: Secondary | ICD-10-CM

## 2014-01-09 DIAGNOSIS — R079 Chest pain, unspecified: Secondary | ICD-10-CM

## 2014-01-09 DIAGNOSIS — R0789 Other chest pain: Secondary | ICD-10-CM

## 2014-01-09 DIAGNOSIS — M25512 Pain in left shoulder: Secondary | ICD-10-CM

## 2014-01-09 MED ORDER — CYCLOBENZAPRINE HCL 10 MG PO TABS: 10.0000 mg | ORAL_TABLET | Freq: Three times a day (TID) | ORAL | Status: DC | PRN

## 2014-01-09 MED ORDER — MELOXICAM 15 MG PO TABS: 15.0000 mg | ORAL_TABLET | Freq: Every day | ORAL | Status: DC

## 2014-01-09 NOTE — Patient Instructions (Addendum)
Your physician has recommended you make the following change in your medication: start the new prescriptions given for meloxicam and flexeril. These has already been sent to the pharmacy.  Your physician recommends that you schedule a follow-up appointment as needed with Dr. Claiborne Billings.

## 2014-01-12 ENCOUNTER — Encounter: Payer: Self-pay | Admitting: Cardiovascular Disease

## 2014-01-12 DIAGNOSIS — Z87891 Personal history of nicotine dependence: Secondary | ICD-10-CM | POA: Insufficient documentation

## 2014-01-12 DIAGNOSIS — M25512 Pain in left shoulder: Secondary | ICD-10-CM | POA: Insufficient documentation

## 2014-01-12 DIAGNOSIS — R0789 Other chest pain: Secondary | ICD-10-CM | POA: Insufficient documentation

## 2014-01-12 NOTE — Progress Notes (Signed)
Patient ID: Jamie Klein, female   DOB: April 15, 1971, 43 y.o.   MRN: [id number]     HPI: Jamie Klein is a 43 y.o. female who presents for cardiology follow up evaluation. I last saw her in December 2013. Chief complaint is that of left shoulder discomfort.  Jamie Klein is a 43 year old Afro-American female with a history of tobacco use since age 66. She was referred to me in 2013 with left-sided sharp chest discomfort with atypical features. There was also concern of possible abnormal EKG. At that time, she was referred for a exercise treadmill test in which he was able to achieve a maximal heart rate of 127 beats per minute. She did not develop chest pain or ECG changes buttered stress level was suboptimal in that she achieved only 70% of age-predicted maximum heart rate. She does have a long standing history of chronic back discomfort. Recently, she has developed left shoulder discomfort which hurts when lifting her left arm. She denies an exertional component to this. She apparently saw Dr. Luretha Rued and was given a prescription for Voltaren which she has taken for approximately 2 weeks without benefit. She now presents to see me for evaluation.  Past Medical History  Diagnosis Date  . Abnormal Pap smear   . Anemia   . Hypertension   . Chronic back pain   . Shortness of breath   . Chest pain 11/15/2012    2D Echo - EF 55-60%, normal    Past Surgical History  Procedure Laterality Date  . Cesarean section      x3  . Tubal ligation    . Foot surgery Bilateral 2006, 2009    No Known Allergies  Current Outpatient Prescriptions  Medication Sig Dispense Refill  . diclofenac (VOLTAREN) 50 MG EC tablet Take 50 mg by mouth 2 (two) times daily.      . cyclobenzaprine (FLEXERIL) 10 MG tablet Take 1 tablet (10 mg total) by mouth 3 (three) times daily as needed for muscle spasms.  30 tablet  0  . furosemide (LASIX) 20 MG tablet Take 20 mg by mouth 2 (two) times daily.      .  Hydrocodone-Acetaminophen (VICODIN PO) Take by mouth as needed.      . meloxicam (MOBIC) 15 MG tablet Take 1 tablet (15 mg total) by mouth daily.  14 tablet  0  . Multiple Vitamins-Minerals (MULTIVITAMIN PO) Take by mouth daily.      . potassium chloride (K-DUR) 10 MEQ tablet Take 10 mEq by mouth 2 (two) times daily.      . QUEtiapine (SEROQUEL XR) 300 MG 24 hr tablet Take 300 mg by mouth at bedtime.      . traMADol (ULTRAM) 50 MG tablet Take 50 mg by mouth every 6 (six) hours as needed. For pain        No current facility-administered medications for this visit.    History   Social History  . Marital Status: Single    Spouse Name: N/A    Number of Children: N/A  . Years of Education: N/A   Occupational History  . Not on file.   Social History Main Topics  . Smoking status: Current Every Day Smoker -- 0.30 packs/day  . Smokeless tobacco: Never Used  . Alcohol Use: No  . Drug Use: Not on file  . Sexual Activity: Not Currently    Birth Control/ Protection: Surgical   Other Topics Concern  . Not on file   Social History  Narrative  . No narrative on file   Socially she is single. She has 3 children and one grandchild. She does smoke. There is no alcohol use. She does not routinely exercise. Family History  Problem Relation Age of Onset  . CVA Father   . HIV/AIDS Sister     ROS is negative for fevers, chills or night sweats.  She denies change in vision or hearing. She is unaware of lymphadenopathy. She does have difficulty raising her left arm do to comfort at the shoulder. She denies palpitations. She denies wheezing. She denies exertional chest tightness. She denies nausea vomiting or diarrhea. She does have chronic back aches. She denies blood in stool or urine. She denies change of bowel or bladder habits. She denies claudication. She denies significant edema. She is unaware of any diabetes. There is no thyroid abnormalities. She denies sleep disturbance breathing. Other  comprehensive 14 point system review is negative.  PE BP 112/70  Pulse 85  Ht 5\' 3"  (1.6 m)  Wt 178 lb 12.8 oz (81.103 kg)  BMI 31.68 kg/m2  LMP 12/22/2013  General: Alert, oriented, no distress.  Skin: normal turgor, no rashes HEENT: Normocephalic, atraumatic. Pupils round and reactive; sclera anicteric;no lid lag. Extraocular muscles intact;; no xanthelasmas. Nose without nasal septal hypertrophy Mouth/Parynx benign; Mallinpatti scale 3 Neck: No JVD, no carotid bruits; normal carotid upstroke Lungs: clear to ausculatation and percussion; no wheezing or rales Chest wall: no tenderness to palpitation Heart: RRR, s1 s2 normal; no diastolic murmur, rub thrills or heaves Abdomen: soft, nontender; no hepatosplenomehaly, BS+; abdominal aorta nontender and not dilated by palpation. Muscle skeletal: She does have pain with abduction and she states she cannot lift her arm over her head. She also has difficulty holding her left arm forward with shoulder discomfort the forward. Back: no CVA tenderness Pulses 2+ Extremities: no clubbing cyanosis or edema, Homan's sign negative  Neurologic: grossly nonfocal; cranial nerves grossly normal. Psychologic: normal affect and mood.  ECG (independently read by me): Sinus rhythm 85 beats per minute. No ectopy. Normal intervals.  LABS:  BMET No results found for this basename: na, k, cl, co2, glucose, bun, creatinine, calcium, gfrnonaa, gfraa     Hepatic Function Panel  No results found for this basename: prot, albumin, ast, alt, alkphos, bilitot, bilidir, ibili     CBC    Component Value Date/Time   WBC 12.0* 05/12/2011 1946   RBC 3.90 05/12/2011 1946   HGB 11.6* 05/12/2011 1946   HCT 34.6* 05/12/2011 1946   PLT 239 05/12/2011 1946   MCV 88.7 05/12/2011 1946   MCH 29.7 05/12/2011 1946   MCHC 33.5 05/12/2011 1946   RDW 13.9 05/12/2011 1946   LYMPHSABS 2.5 05/12/2011 1946   MONOABS 1.0 05/12/2011 1946   EOSABS 0.0 05/12/2011 1946   BASOSABS 0.0  05/12/2011 1946     BNP No results found for this basename: probnp    Lipid Panel  No results found for this basename: chol, trig, hdl, cholhdl, vldl, ldlcalc     RADIOLOGY: Dg Shoulder Right  12/23/2013   CLINICAL DATA:  Right shoulder pain  EXAM: RIGHT SHOULDER - 2+ VIEW  COMPARISON:  None.  FINDINGS: There is no evidence of fracture or dislocation. There is no evidence of arthropathy or other focal bone abnormality. Soft tissues are unremarkable.  IMPRESSION: Negative.   Electronically Signed   By: Margaree Mackintosh M.D.   On: 12/23/2013 12:59      ASSESSMENT AND PLAN:  Jamie Klein presents to the office today for cardiology evaluation. Remotely, she had experienced atypical chest pain that was mostly felt to be due to musculoskeletal etiology. She had a negative graded exercise treadmill study but had a suboptimal heart rate response and only achieved 70% of predicted maximum. Her left shoulder discomfort today is clearly musculoskeletal and not cardiac. She has been on Voltaren without significant benefit. I did suggest to her that she may require orthopedic evaluation if continued symptoms develop to to further evaluate potential rotator cuff abnormalities. A recent x-ray did not reveal evidence for nephropathy or other focal bone abnormality and did not show evidence for fracture or dislocation. Presently, given her prescription for Mobic to take 50 mg and also given her a prescription for Flexeril to take 10 mg 3 times a day as needed. I have recommended she followup with Dr. Jeanie Cooks and consider orthopedic evaluation if continued symptoms. I do not believe her current symptomatology is cardiac in etiology. We again discussed cardiac risk factors.  I will be available if cardiac problems arise.     Troy Sine, MD, [facility]  01/12/2014 3:51 PM

## 2014-01-31 ENCOUNTER — Other Ambulatory Visit: Payer: Medicaid Other

## 2014-02-04 ENCOUNTER — Other Ambulatory Visit: Payer: Medicaid Other

## 2014-02-14 ENCOUNTER — Other Ambulatory Visit: Payer: Medicaid Other

## 2014-03-19 ENCOUNTER — Other Ambulatory Visit: Payer: Medicaid Other

## 2014-03-19 ENCOUNTER — Inpatient Hospital Stay: Admission: RE | Admit: 2014-03-19 | Payer: Medicaid Other | Source: Ambulatory Visit

## 2014-05-08 ENCOUNTER — Other Ambulatory Visit: Payer: Self-pay | Admitting: Internal Medicine

## 2014-05-08 ENCOUNTER — Other Ambulatory Visit: Payer: Self-pay | Admitting: *Deleted

## 2014-05-08 DIAGNOSIS — N63 Unspecified lump in unspecified breast: Secondary | ICD-10-CM

## 2014-05-15 ENCOUNTER — Other Ambulatory Visit: Payer: Medicaid Other

## 2014-06-03 ENCOUNTER — Other Ambulatory Visit: Payer: Self-pay

## 2014-06-03 ENCOUNTER — Other Ambulatory Visit: Payer: Self-pay | Admitting: *Deleted

## 2014-06-03 DIAGNOSIS — N63 Unspecified lump in unspecified breast: Secondary | ICD-10-CM

## 2014-08-25 ENCOUNTER — Encounter: Payer: Self-pay | Admitting: Cardiovascular Disease

## 2015-05-09 IMAGING — CR DG SHOULDER 2+V*R*
3 series · 3 of 3 positions shown · non-contrast
Comparison: None.

CLINICAL DATA: Right shoulder pain

EXAM:
RIGHT SHOULDER - 2+ VIEW

[w shoulder ap internal righ]
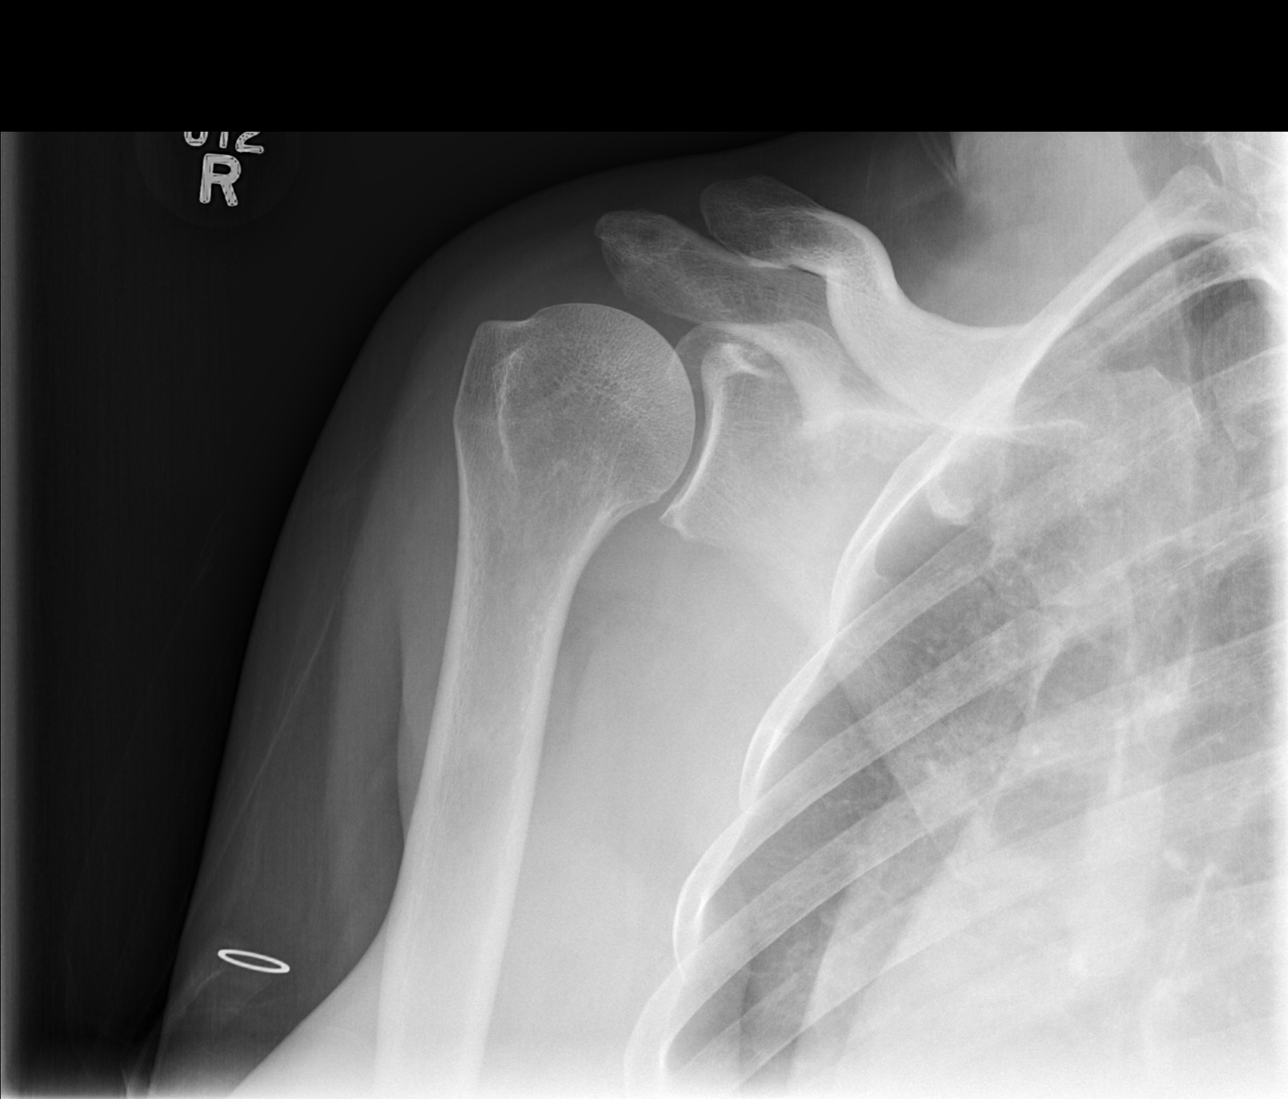

[w shoulder ap external righ]
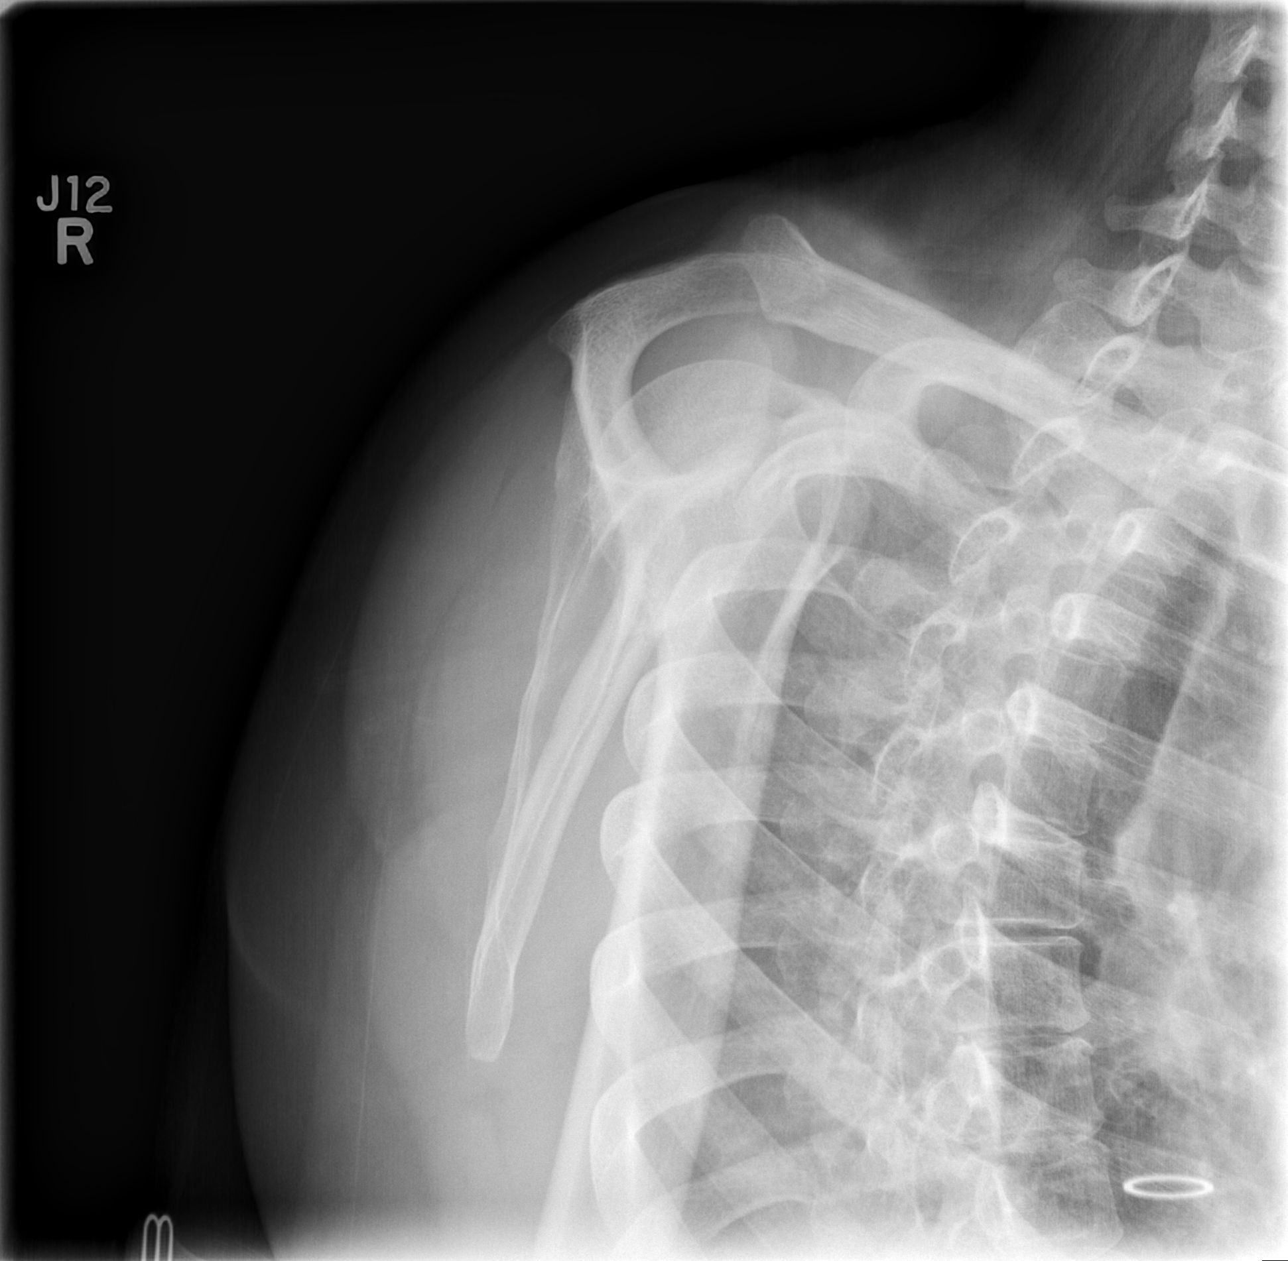

[x shoulder axillary right]
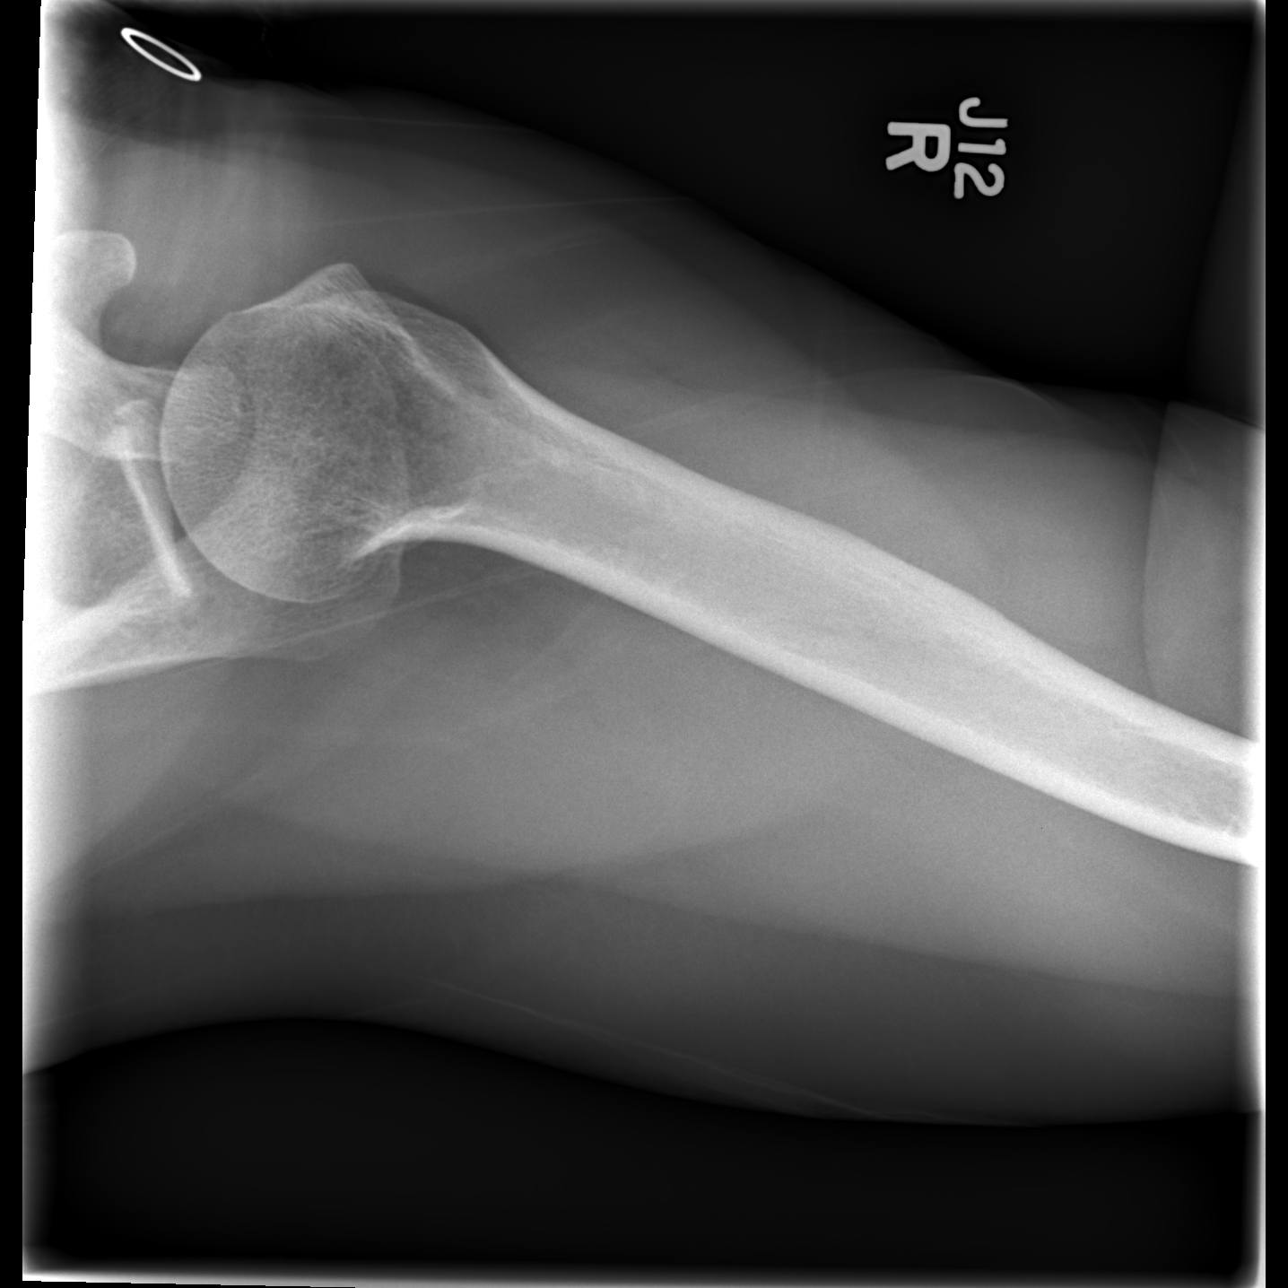

[3 of 3 positions shown; findings below may reference images not displayed]

FINDINGS: There is no evidence of fracture or dislocation. There is no
evidence of arthropathy or other focal bone abnormality. Soft
tissues are unremarkable.
IMPRESSION: Negative.

## 2023-07-29 ENCOUNTER — Encounter (HOSPITAL_COMMUNITY): Payer: Self-pay | Admitting: Emergency Medicine

## 2023-07-29 ENCOUNTER — Emergency Department (HOSPITAL_COMMUNITY)
Admission: EM | Admit: 2023-07-29 | Discharge: 2023-07-30 | Disposition: A | Discharge status: Home / Self Care | Payer: Managed Care, Other (non HMO) | Attending: Emergency Medicine | Admitting: Emergency Medicine

## 2023-07-29 DIAGNOSIS — N3000 Acute cystitis without hematuria: Secondary | ICD-10-CM | POA: Diagnosis not present

## 2023-07-29 DIAGNOSIS — I10 Essential (primary) hypertension: Secondary | ICD-10-CM | POA: Insufficient documentation

## 2023-07-29 DIAGNOSIS — R109 Unspecified abdominal pain: Secondary | ICD-10-CM | POA: Diagnosis present

## 2023-07-29 LAB — COMPREHENSIVE METABOLIC PANEL
ALT: 16 U/L (ref 0–44)
AST: 23 U/L (ref 15–41)
Albumin: 3.6 g/dL (ref 3.5–5.0)
Alkaline Phosphatase: 54 U/L (ref 38–126)
Anion gap: 7 (ref 5–15)
BUN: 8 mg/dL (ref 6–20)
CO2: 26 mmol/L (ref 22–32)
Calcium: 8.5 mg/dL — ABNORMAL LOW (ref 8.9–10.3)
Chloride: 104 mmol/L (ref 98–111)
Creatinine, Ser: 0.99 mg/dL (ref 0.44–1.00)
GFR, Estimated: >60 mL/min (ref >60)
Glucose, Bld: 97 mg/dL (ref 70–99)
Potassium: 3.9 mmol/L (ref 3.5–5.1)
Sodium: 137 mmol/L (ref 135–145)
Total Bilirubin: 0.7 mg/dL (ref 0.3–1.2)
Total Protein: 6 g/dL — ABNORMAL LOW (ref 6.5–8.1)

## 2023-07-29 LAB — CBC WITH DIFFERENTIAL/PLATELET
Abs Immature Granulocytes: 0.03 10*3/uL (ref 0.00–0.07)
Basophils Absolute: 0.1 10*3/uL (ref 0.0–0.1)
Basophils Relative: 1 %
Eosinophils Absolute: 0.2 10*3/uL (ref 0.0–0.5)
Eosinophils Relative: 2 %
HCT: 39.5 % (ref 36.0–46.0)
Hemoglobin: 12.9 g/dL (ref 12.0–15.0)
Immature Granulocytes: 0 %
Lymphocytes Relative: 43 %
Lymphs Abs: 4.8 10*3/uL — ABNORMAL HIGH (ref 0.7–4.0)
MCH: 29.5 pg (ref 26.0–34.0)
MCHC: 32.7 g/dL (ref 30.0–36.0)
MCV: 90.4 fL (ref 80.0–100.0)
Monocytes Absolute: 0.5 10*3/uL (ref 0.1–1.0)
Monocytes Relative: 4 %
Neutro Abs: 5.5 10*3/uL (ref 1.7–7.7)
Neutrophils Relative %: 50 %
Platelets: 352 10*3/uL (ref 150–400)
RBC: 4.37 MIL/uL (ref 3.87–5.11)
RDW: 13.9 % (ref 11.5–15.5)
WBC: 11 10*3/uL — ABNORMAL HIGH (ref 4.0–10.5)
nRBC: 0 % (ref 0.0–0.2)

## 2023-07-29 LAB — URINALYSIS, ROUTINE W REFLEX MICROSCOPIC
Bilirubin Urine: NEGATIVE
Glucose, UA: NEGATIVE mg/dL
Hgb urine dipstick: NEGATIVE
Ketones, ur: NEGATIVE mg/dL
Nitrite: POSITIVE — AB
Protein, ur: 30 mg/dL — AB
Specific Gravity, Urine: 1.02 (ref 1.005–1.030)
WBC, UA: >50 WBC/hpf (ref 0–5)
pH: 6 (ref 5.0–8.0)

## 2023-07-29 LAB — HCG, SERUM, QUALITATIVE: Preg, Serum: NEGATIVE

## 2023-07-29 MED ORDER — OXYCODONE-ACETAMINOPHEN 5-325 MG PO TABS
1.0000 | ORAL_TABLET | Freq: Once | ORAL | Status: AC
  Administered 2023-07-29: 1 via ORAL
  Filled 2023-07-29: qty 1

## 2023-07-29 NOTE — ED Triage Notes (Signed)
Pain in R flank x 2 days constant and does not improve. Denies nausea, vomiting, diarrhea, fevers, urinary symptoms. Last BM was todAy.

## 2023-07-30 ENCOUNTER — Emergency Department (HOSPITAL_COMMUNITY): Payer: Managed Care, Other (non HMO)

## 2023-07-30 MED ORDER — TRAMADOL HCL 50 MG PO TABS
50.0000 mg | ORAL_TABLET | Freq: Four times a day (QID) | ORAL | 0 refills | Status: DC | PRN
Start: 2023-07-30 — End: 2023-07-30

## 2023-07-30 MED ORDER — ONDANSETRON 4 MG PO TBDP: 4.0000 mg | ORAL_TABLET | Freq: Three times a day (TID) | ORAL | 0 refills | Status: DC | PRN

## 2023-07-30 MED ORDER — SODIUM CHLORIDE 0.9 % IV SOLN
1.0000 g | Freq: Once | INTRAVENOUS | Status: AC
  Administered 2023-07-30: 1 g via INTRAVENOUS
  Filled 2023-07-30: qty 10

## 2023-07-30 MED ORDER — CEFUROXIME AXETIL 500 MG PO TABS: 500.0000 mg | ORAL_TABLET | Freq: Two times a day (BID) | ORAL | 0 refills | Status: DC

## 2023-07-30 MED ORDER — TRAMADOL HCL 50 MG PO TABS: 50.0000 mg | ORAL_TABLET | Freq: Four times a day (QID) | ORAL | 0 refills | Status: DC | PRN

## 2023-07-30 MED ORDER — KETOROLAC TROMETHAMINE 15 MG/ML IJ SOLN
15.0000 mg | Freq: Once | INTRAMUSCULAR | Status: AC
  Administered 2023-07-30: 15 mg via INTRAVENOUS
  Filled 2023-07-30: qty 1

## 2023-07-30 MED ORDER — OXYCODONE-ACETAMINOPHEN 5-325 MG PO TABS
1.0000 | ORAL_TABLET | Freq: Once | ORAL | Status: AC
  Administered 2023-07-30: 1 via ORAL
  Filled 2023-07-30: qty 1

## 2023-07-30 NOTE — Discharge Instructions (Addendum)
Take Ceftin as prescribed until finished.  We recommend Aleve twice a day for management of pain.  You may take Zofran as prescribed for nausea.  Should you experience ongoing, moderate to severe pain, you have been given a prescription for tramadol to use.  Do not drive or drink alcohol after taking this medication as it may make you drowsy and impair your judgment.  Follow-up with a primary care doctor to ensure resolution of symptoms.  Return to the ED if symptoms persist or worsen.

## 2023-07-30 NOTE — ED Provider Notes (Signed)
[facility] AT [facility] Provider Note   CSN: [id number] Arrival date & time: 07/29/23  2044     History  Chief Complaint  Patient presents with   Flank Pain    Jamie Klein is a 52 y.o. female.  52 year old female presents to the emergency department for evaluation of 3 days of abdominal pain.  She has been experiencing pain in the right side of her abdomen and right flank.  This has been constant, gradually worsening.  No interventions attempted for symptoms.  Patient denies associated fever, nausea, vomiting, diarrhea, dysuria, hematuria, urinary frequency or urgency.  Her last bowel movement was earlier yesterday (Saturday).  Abdominal surgical history significant for C-section and tubal ligation.  The history is provided by the patient. No language interpreter was used.  Flank Pain       Home Medications Prior to Admission medications   Medication Sig Start Date End Date Taking? Authorizing Provider  cefUROXime (CEFTIN) 500 MG tablet Take 1 tablet (500 mg total) by mouth 2 (two) times daily with a meal. 07/30/23   Antony Madura, PA-C  diclofenac (VOLTAREN) 50 MG EC tablet Take 50 mg by mouth 2 (two) times daily.    [provider]  furosemide (LASIX) 20 MG tablet Take 20 mg by mouth 2 (two) times daily.    [provider]  Hydrocodone-Acetaminophen (VICODIN PO) Take by mouth as needed.    [provider]  Multiple Vitamins-Minerals (MULTIVITAMIN PO) Take by mouth daily.    [provider]  ondansetron (ZOFRAN-ODT) 4 MG disintegrating tablet Take 1 tablet (4 mg total) by mouth every 8 (eight) hours as needed for nausea or vomiting. 07/30/23   Antony Madura, PA-C  potassium chloride (K-DUR) 10 MEQ tablet Take 10 mEq by mouth 2 (two) times daily.    [provider]  QUEtiapine (SEROQUEL XR) 300 MG 24 hr tablet Take 300 mg by mouth at bedtime.    [provider]  traMADol (ULTRAM) 50 MG tablet  Take 1 tablet (50 mg total) by mouth every 6 (six) hours as needed for moderate pain or severe pain. 07/30/23   Antony Madura, PA-C      Allergies    Patient has no known allergies.    Review of Systems   Review of Systems  Genitourinary:  Positive for flank pain.  Ten systems reviewed and are negative for acute change, except as noted in the HPI.    Physical Exam Updated Vital Signs BP 110/64   Pulse 67   Temp 98 F (36.7 C) (Oral)   Resp 16   LMP 12/22/2013   SpO2 98%   Physical Exam Vitals and nursing note reviewed.  Constitutional:      General: She is not in acute distress.    Appearance: She is well-developed. She is not diaphoretic.  HENT:     Head: Normocephalic and atraumatic.  Eyes:     General: No scleral icterus.    Extraocular Movements: EOM normal.     Conjunctiva/sclera: Conjunctivae normal.  Pulmonary:     Effort: Pulmonary effort is normal. No respiratory distress.  Abdominal:     Palpations: Abdomen is soft.  Musculoskeletal:        General: Normal range of motion.     Cervical back: Normal range of motion.  Skin:    General: Skin is warm and dry.     Coloration: Skin is not pale.     Findings: No erythema or rash.  Neurological:     Mental Status: She is alert and oriented to person, place, and time.  Psychiatric:        Mood and Affect: Mood and affect normal.        Behavior: Behavior normal.     ED Results / Procedures / Treatments   Labs (all labs ordered are listed, but only abnormal results are displayed) Labs Reviewed  URINALYSIS, ROUTINE W REFLEX MICROSCOPIC - Abnormal; Notable for the following components:      Result Value   Color, Urine AMBER (*)    APPearance HAZY (*)    Protein, ur 30 (*)    Nitrite POSITIVE (*)    Leukocytes,Ua LARGE (*)    Bacteria, UA MANY (*)    All other components within normal limits  COMPREHENSIVE METABOLIC PANEL - Abnormal; Notable for the following components:   Calcium 8.5 (*)    Total Protein  6.0 (*)    All other components within normal limits  CBC WITH DIFFERENTIAL/PLATELET - Abnormal; Notable for the following components:   WBC 11.0 (*)    Lymphs Abs 4.8 (*)    All other components within normal limits  HCG, SERUM, QUALITATIVE    EKG None  Radiology CT Renal Stone Study  Result Date: 07/30/2023 CLINICAL DATA:  Right flank pain.  Stone suspected. EXAM: CT ABDOMEN AND PELVIS WITHOUT CONTRAST TECHNIQUE: Multidetector CT imaging of the abdomen and pelvis was performed following the standard protocol without IV contrast. RADIATION DOSE REDUCTION: This exam was performed according to the departmental dose-optimization program which includes automated exposure control, adjustment of the mA and/or kV according to patient size and/or use of iterative reconstruction technique. COMPARISON:  None Available. FINDINGS: Lower chest: Lung bases show scattered linear atelectasis or scarring without infiltrates. The cardiac size is normal. Hepatobiliary: There is a 1.5 cm cyst in the dome of hepatic segment 2, Hounsfield density is 2.4. The unenhanced liver is otherwise unremarkable. Gallbladder and bile ducts are unremarkable. Pancreas: Unremarkable without contrast. Spleen: Unremarkable without contrast.  No splenomegaly. Adrenals/Urinary Tract: There is no adrenal mass. There is no contour deforming abnormality of either kidney. There are occasional punctate bilateral scattered nonobstructing caliceal stones in the kidneys. No hydronephrosis or ureteral stones are seen. There are no intravesical stones or bladder thickening. Stomach/Bowel: No dilatation or wall thickening including the appendix. Moderate fecal stasis ascending and transverse colon. Vascular/Lymphatic: Aortic atherosclerosis. No enlarged abdominal or pelvic lymph nodes. Reproductive: Uterus and bilateral adnexa are unremarkable. Other: No abdominal wall hernia or abnormality. No abdominopelvic ascites. Musculoskeletal: No acute or  significant osseous findings. IMPRESSION: 1. No acute noncontrast CT findings. 2. Nonobstructive micronephrolithiasis. 3. Constipation. 4. Aortic atherosclerosis. 5. 1.5 cm cyst in the dome of the liver in segment 2. Aortic Atherosclerosis (ICD10-I70.0). Electronically Signed   By: Almira Bar M.D.   On: 07/30/2023 01:19    Procedures Procedures    Medications Ordered in ED Medications  oxyCODONE-acetaminophen (PERCOCET/ROXICET) 5-325 MG per tablet 1 tablet (1 tablet Oral Given 07/29/23 2123)  cefTRIAXone (ROCEPHIN) 1 g in sodium chloride 0.9 % 100 mL IVPB (0 g Intravenous Stopped 07/30/23 0410)  ketorolac (TORADOL) 15 MG/ML injection 15 mg (15 mg Intravenous Given 07/30/23 0320)  oxyCODONE-acetaminophen (PERCOCET/ROXICET) 5-325 MG per tablet 1 tablet (1 tablet Oral Given 07/30/23 0314)  ketorolac (TORADOL) 15 MG/ML injection 15 mg (15 mg Intravenous Given 07/30/23 0418)    ED Course/ Medical Decision Making/ A&P  Medical Decision Making Amount and/or Complexity of Data Reviewed Labs: ordered.  Risk Prescription drug management.   This patient presents to the ED for concern of R abdominal and flank pain, this involves an extensive number of treatment options, and is a complaint that carries with it a high risk of complications and morbidity.  The differential diagnosis includes UTI vs pyelonephritis vs appendicitis vs ectopic pregnancy vs ovarian cyst vs viral illness   Co morbidities that complicate the patient evaluation  HTN Chronic back pain   Lab Tests:  I Ordered, and personally interpreted labs.  The pertinent results include:  UA with bacteriuria, pyuria, nitrites c/w UTI. Preserved creatinine. WBC 11.0.   Imaging Studies ordered:  I ordered imaging studies including CT renal study  I independently visualized and interpreted imaging which showed no acute pathology in the abdomen or pelvis. I agree with the radiologist  interpretation   Cardiac Monitoring:  The patient was maintained on a cardiac monitor.  I personally viewed and interpreted the cardiac monitored which showed an underlying rhythm of: NSR   Medicines ordered and prescription drug management:  I ordered medication including Toradol and percocet for pain. Given 1g IV Rocephin prior to discharge  Reevaluation of the patient after these medicines showed that the patient improved I have reviewed the patients home medicines and have made adjustments as needed   Test Considered:  Wet prep   Problem List / ED Course:  Pt has been diagnosed with a UTI. Pt is afebrile, no CVA tenderness, normotensive, and denies N/V. Patient to be discharged home with antibiotics and instructions to follow up with PCP if symptoms persist.   Reevaluation:  After the interventions noted above, I reevaluated the patient and found that they have : remained stable   Dispostion:  After consideration of the diagnostic results and the patients response to treatment, I feel that the patent would benefit from outpatient PCP f/u. Given Rx for Ceftin course. Return precautions discussed and provided. Patient discharged in stable condition with no unaddressed concerns.          Final Clinical Impression(s) / ED Diagnoses Final diagnoses:  Acute cystitis without hematuria    Rx / DC Orders ED Discharge Orders          Ordered    cefUROXime (CEFTIN) 500 MG tablet  2 times daily with meals,   Status:  Discontinued        07/30/23 0434    ondansetron (ZOFRAN-ODT) 4 MG disintegrating tablet  Every 8 hours PRN,   Status:  Discontinued        07/30/23 0434    traMADol (ULTRAM) 50 MG tablet  Every 6 hours PRN,   Status:  Discontinued        07/30/23 0434    cefUROXime (CEFTIN) 500 MG tablet  2 times daily with meals        07/30/23 0508    ondansetron (ZOFRAN-ODT) 4 MG disintegrating tablet  Every 8 hours PRN        07/30/23 0508    traMADol (ULTRAM) 50  MG tablet  Every 6 hours PRN        07/30/23 0508              Antony Madura, PA-C 07/30/23 Terrall Laity, MD 07/30/23 [phone]

## 2023-08-04 ENCOUNTER — Encounter (HOSPITAL_BASED_OUTPATIENT_CLINIC_OR_DEPARTMENT_OTHER): Payer: Self-pay | Admitting: Emergency Medicine

## 2023-08-04 ENCOUNTER — Other Ambulatory Visit: Payer: Self-pay

## 2023-08-04 ENCOUNTER — Emergency Department (HOSPITAL_BASED_OUTPATIENT_CLINIC_OR_DEPARTMENT_OTHER)
Admission: EM | Admit: 2023-08-04 | Discharge: 2023-08-04 | Disposition: A | Discharge status: Home / Self Care | Payer: Managed Care, Other (non HMO) | Attending: Emergency Medicine | Admitting: Emergency Medicine

## 2023-08-04 DIAGNOSIS — T148XXA Other injury of unspecified body region, initial encounter: Secondary | ICD-10-CM

## 2023-08-04 DIAGNOSIS — M791 Myalgia, unspecified site: Secondary | ICD-10-CM | POA: Insufficient documentation

## 2023-08-04 DIAGNOSIS — R109 Unspecified abdominal pain: Secondary | ICD-10-CM | POA: Insufficient documentation

## 2023-08-04 MED ORDER — DICLOFENAC SODIUM 50 MG PO TBEC: 50.0000 mg | DELAYED_RELEASE_TABLET | Freq: Three times a day (TID) | ORAL | 0 refills | Status: DC

## 2023-08-04 MED ORDER — OXYCODONE-ACETAMINOPHEN 5-325 MG PO TABS: 1.0000 | ORAL_TABLET | Freq: Four times a day (QID) | ORAL | 0 refills | Status: DC | PRN

## 2023-08-04 MED ORDER — METHOCARBAMOL 500 MG PO TABS: 500.0000 mg | ORAL_TABLET | Freq: Two times a day (BID) | ORAL | 0 refills | Status: DC

## 2023-08-04 NOTE — Discharge Instructions (Addendum)
Thank you for allowing [place] to be a part of your care today.  You were evaluated in the ED for ongoing flank and abdominal pain.  I suspect this is musculoskeletal in nature.  Continue to take your antibiotic to completion.  I have sent over a pain medicine to take for severe or breakthrough pain.  I have also sent in a muscle relaxant and a prescription strength anti-inflammatory.  Take the anti-inflammatory as prescribed to help with underlying inflammation.  Take the muscle relaxant as needed.  Do not consume alcohol, drive, or perform tasks that require you to be completely alert and awake while taking muscle relaxant or pain medicine as these medications often cause sedation.  Discontinue taking the tramadol.  Call the health connect phone number to schedule an appointment with a primary care provider for follow-up.  Return to the ED if you develop sudden worsening of your symptoms or if you have new concerns.

## 2023-08-04 NOTE — ED Triage Notes (Signed)
  Patient comes in with R sided abdominal pain that has been going on for 1 week.  Patient states she was seen on 10/5 and diagnosed with UTI.  Patient placed on Cefuroxime and ultram, states she has been taking them appropriately and only has 1 more days worth of antibiotics.  Patient denies any N/V, dysuria, but states she has a soreness/aching pain on that R side.  States pain is worse when she moves around or bends over.  Pain 7/10, soreness/aching.

## 2023-08-04 NOTE — ED Provider Notes (Signed)
[place] EMERGENCY DEPARTMENT AT [facility] Provider Note   CSN: [id number] Arrival date & time: 08/04/23  1903     History  Chief Complaint  Patient presents with   Abdominal Pain    Jamie Klein is a 52 y.o. female presents to the ED complaining of right-sided flank and abdominal pain that is worse with movement and straining.  Patient was seen last week for similar symptoms and was diagnosed with UTI which she is taking antibiotic for.  Patient has also been taking tramadol and anti-inflammatories without improvement in pain.  Denies urinary symptoms, fever.  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  She reports it only hurts with movement and straining.      Home Medications Prior to Admission medications   Medication Sig Start Date End Date Taking? Authorizing Provider  diclofenac (VOLTAREN) 50 MG EC tablet Take 1 tablet (50 mg total) by mouth 3 (three) times daily. 08/04/23  Yes Honey Zakarian R, PA-C  methocarbamol (ROBAXIN) 500 MG tablet Take 1 tablet (500 mg total) by mouth 2 (two) times daily. 08/04/23  Yes Thong Feeny R, PA-C  oxyCODONE-acetaminophen (PERCOCET/ROXICET) 5-325 MG tablet Take 1 tablet by mouth every 6 (six) hours as needed for severe pain. 08/04/23  Yes Uthman Mroczkowski R, PA-C  cefUROXime (CEFTIN) 500 MG tablet Take 1 tablet (500 mg total) by mouth 2 (two) times daily with a meal. 07/30/23   Antony Madura, PA-C  furosemide (LASIX) 20 MG tablet Take 20 mg by mouth 2 (two) times daily.    [provider]  Multiple Vitamins-Minerals (MULTIVITAMIN PO) Take by mouth daily.    [provider]  ondansetron (ZOFRAN-ODT) 4 MG disintegrating tablet Take 1 tablet (4 mg total) by mouth every 8 (eight) hours as needed for nausea or vomiting. 07/30/23   Antony Madura, PA-C  potassium chloride (K-DUR) 10 MEQ tablet Take 10 mEq by mouth 2 (two) times daily.    [provider]   QUEtiapine (SEROQUEL XR) 300 MG 24 hr tablet Take 300 mg by mouth at bedtime.    [provider]      Allergies    Patient has no known allergies.    Review of Systems   Review of Systems  Gastrointestinal:  Positive for abdominal pain.    Physical Exam Updated Vital Signs BP 97/63 (BP Location: Right Arm)   Pulse 96   Temp 98.7 F (37.1 C) (Oral)   Resp 18   Ht 5\' 3"  (1.6 m)   Wt 71.2 kg   LMP 12/22/2013   SpO2 100%   BMI 27.81 kg/m  Physical Exam Vitals and nursing note reviewed.  Constitutional:      General: She is not in acute distress.    Appearance: Normal appearance. She is not ill-appearing or diaphoretic.  Cardiovascular:     Rate and Rhythm: Normal rate and regular rhythm.  Pulmonary:     Effort: Pulmonary effort is normal.  Abdominal:     General: Abdomen is flat.     Palpations: Abdomen is soft.     Tenderness: There is no abdominal tenderness. There is no right CVA tenderness.     Comments: No reproducible pain with palpation  Musculoskeletal:     Comments: Patient does have right sided flank into abdominal pain with movement.  Pain also radiates into the hip when bending forward.  Skin:    General: Skin is warm and  dry.     Capillary Refill: Capillary refill takes less than 2 seconds.  Neurological:     Mental Status: She is alert. Mental status is at baseline.  Psychiatric:        Mood and Affect: Mood normal.        Behavior: Behavior normal.     ED Results / Procedures / Treatments   Labs (all labs ordered are listed, but only abnormal results are displayed) Labs Reviewed - No data to display  EKG None  Radiology No results found.  Procedures Procedures    Medications Ordered in ED Medications - No data to display  ED Course/ Medical Decision Making/ A&P                                 Medical Decision Making  This patient presents to the ED with chief complaint(s) of right flank and abdominal pain with pertinent  past medical history of UTI.  The complaint involves an extensive differential diagnosis and also carries with it a high risk of complications and morbidity.    The differential diagnosis includes musculoskeletal strain   Initial Assessment:   Exam significant for overall well-appearing patient is not in acute distress.  She does not have reproducible abdominal tenderness or flank pain on palpation.  She does have right-sided flank pain that radiates into the abdomen with movement, specifically voluntarily tightening her abdominal wall muscles or forward flexion.  Pain also radiates into the hip when bending forward.  Gait is intact.  Patient otherwise appears comfortable when not moving.  Based on physical exam findings and lack of urinary symptoms or fever, I do not feel that patient requires further imaging or laboratory workup today.  Disposition:   Suspect patient symptoms are musculoskeletal in nature given that they are worse with movement and straining.  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  She reports pain is worse at these times.  Prescription strength NSAID and muscle relaxant sent to the pharmacy to treat this.  Short course of oxycodone also sent in for breakthrough or severe pain.  Health connect phone number provided so patient may schedule PCP follow-up.  The patient has been appropriately medically screened and/or stabilized in the ED. I have low suspicion for any other emergent medical condition which would require further screening, evaluation or treatment in the ED or require inpatient management. At time of discharge the patient is hemodynamically stable and in no acute distress. I have discussed work-up results and diagnosis with patient and answered all questions. Patient is agreeable with discharge plan. We discussed strict return precautions for returning to the emergency department and they verbalized understanding.             Final Clinical Impression(s) / ED Diagnoses Final diagnoses:  Musculoskeletal strain    Rx / DC Orders ED Discharge Orders          Ordered    diclofenac (VOLTAREN) 50 MG EC tablet  3 times daily        08/04/23 1943    methocarbamol (ROBAXIN) 500 MG tablet  2 times daily        08/04/23 1943    oxyCODONE-acetaminophen (PERCOCET/ROXICET) 5-325 MG tablet  Every 6 hours PRN        08/04/23 1943              Lenard Simmer,  PA-C 08/04/23 1947    Terrilee Files, MD 08/05/23 1050

## 2023-09-25 ENCOUNTER — Ambulatory Visit
Admission: EM | Admit: 2023-09-25 | Discharge: 2023-09-25 | Disposition: A | Discharge status: Home / Self Care | Payer: Managed Care, Other (non HMO) | Attending: Family Medicine | Admitting: Family Medicine

## 2023-09-25 DIAGNOSIS — A599 Trichomoniasis, unspecified: Secondary | ICD-10-CM | POA: Diagnosis not present

## 2023-09-25 DIAGNOSIS — Z202 Contact with and (suspected) exposure to infections with a predominantly sexual mode of transmission: Secondary | ICD-10-CM

## 2023-09-25 DIAGNOSIS — Z113 Encounter for screening for infections with a predominantly sexual mode of transmission: Secondary | ICD-10-CM | POA: Diagnosis not present

## 2023-09-25 MED ORDER — METRONIDAZOLE 500 MG PO TABS: 500.0000 mg | ORAL_TABLET | Freq: Two times a day (BID) | ORAL | 0 refills | Status: DC

## 2023-09-25 NOTE — ED Triage Notes (Signed)
Pt requesting STD testing with known trichomonas exposure-denies sx-NAD-steady gait

## 2023-09-25 NOTE — ED Provider Notes (Signed)
[facility] - URGENT [facility]ARE CENTER  Note:  This document was prepared using Conservation officer, historic buildings and may include unintentional dictation errors.  MRN: [id number] DOB: [birth year]  Subjective:   Jamie Klein is a 52 y.o. female presenting for testing for sexually transmitted infections.  Patient reports that she was exposed to trichomonas.  Denies fever, n/v, abdominal pain, pelvic pain, rashes, dysuria, urinary frequency, hematuria, vaginal discharge.    No chronic medications.    No Known Allergies  Past Medical History:  Diagnosis Date   Abnormal Pap smear    Anemia    Chest pain 11/15/2012   2D Echo - EF 55-60%, normal   Chronic back pain    Hypertension    Shortness of breath      Past Surgical History:  Procedure Laterality Date   CESAREAN SECTION     x3   FOOT SURGERY Bilateral 2006, 2009   TUBAL LIGATION      Family History  Problem Relation Age of Onset   CVA Father    HIV/AIDS Sister     Social History   Tobacco Use   Smoking status: Some Days    Current packs/day: 0.30    Types: Cigarettes   Smokeless tobacco: Never  Vaping Use   Vaping status: Never Used  Substance Use Topics   Alcohol use: No   Drug use: Yes    Types: Marijuana    ROS   Objective:   Vitals: BP 98/62 (BP Location: Right Arm)   Pulse (!) 59   Temp 97.9 F (36.6 C) (Oral)   Resp 16   LMP 12/22/2013   SpO2 98%   Physical Exam Constitutional:      General: She is not in acute distress.    Appearance: Normal appearance. She is well-developed. She is not ill-appearing, toxic-appearing or diaphoretic.  HENT:     Head: Normocephalic and atraumatic.     Nose: Nose normal.     Mouth/Throat:     Mouth: Mucous membranes are moist.     Pharynx: Oropharynx is clear.  Eyes:     General: No scleral icterus.       Right eye: No discharge.        Left eye: No discharge.     Extraocular Movements: Extraocular movements intact.     Conjunctiva/sclera:  Conjunctivae normal.  Cardiovascular:     Rate and Rhythm: Normal rate.  Pulmonary:     Effort: Pulmonary effort is normal.  Abdominal:     General: Bowel sounds are normal. There is no distension.     Palpations: Abdomen is soft. There is no mass.     Tenderness: There is no abdominal tenderness. There is no right CVA tenderness, left CVA tenderness, guarding or rebound.  Skin:    General: Skin is warm and dry.  Neurological:     General: No focal deficit present.     Mental Status: She is alert and oriented to person, place, and time.  Psychiatric:        Mood and Affect: Mood normal.        Behavior: Behavior normal.        Thought Content: Thought content normal.        Judgment: Judgment normal.     Assessment and Plan :   PDMP not reviewed this encounter.  1. Screen for STD (sexually transmitted disease)   2. Exposure to trichomonas    Will treat empirically for trichomonas given her exposure.  Will treat otherwise based off of lab results.  Counseled patient on potential for adverse effects with medications prescribed/recommended today, ER and return-to-clinic precautions discussed, patient verbalized understanding.    Wallis Bamberg, PA-C 09/26/23 0900

## 2023-09-25 NOTE — Discharge Instructions (Signed)
I am providing you for a prescription to treat trichomonas infection with metronidazole.  Take this for 7 days.  Avoid having sex of any kind for 2 weeks.  Will let you know about your test results at some point tomorrow and any treatments that you may need from positive results.

## 2023-09-26 ENCOUNTER — Telehealth: Payer: Self-pay

## 2023-09-26 LAB — HIV ANTIBODY (ROUTINE TESTING W REFLEX): HIV Screen 4th Generation wRfx: NONREACTIVE

## 2023-09-26 LAB — RPR: RPR Ser Ql: NONREACTIVE

## 2023-09-27 ENCOUNTER — Other Ambulatory Visit: Payer: Self-pay | Admitting: Internal Medicine

## 2023-09-27 DIAGNOSIS — Z1231 Encounter for screening mammogram for malignant neoplasm of breast: Secondary | ICD-10-CM

## 2023-09-27 LAB — CERVICOVAGINAL ANCILLARY ONLY
Chlamydia: NEGATIVE
Comment: NEGATIVE
Comment: NEGATIVE
Comment: NORMAL
Neisseria Gonorrhea: NEGATIVE
Trichomonas: POSITIVE — AB

## 2023-09-28 DIAGNOSIS — Z1231 Encounter for screening mammogram for malignant neoplasm of breast: Secondary | ICD-10-CM

## 2023-10-04 ENCOUNTER — Ambulatory Visit: Payer: Self-pay | Admitting: Podiatry

## 2024-04-24 ENCOUNTER — Emergency Department (HOSPITAL_BASED_OUTPATIENT_CLINIC_OR_DEPARTMENT_OTHER): Payer: Self-pay

## 2024-04-24 ENCOUNTER — Other Ambulatory Visit: Payer: Self-pay

## 2024-04-24 ENCOUNTER — Encounter (HOSPITAL_BASED_OUTPATIENT_CLINIC_OR_DEPARTMENT_OTHER): Payer: Self-pay | Admitting: *Deleted

## 2024-04-24 ENCOUNTER — Emergency Department (HOSPITAL_BASED_OUTPATIENT_CLINIC_OR_DEPARTMENT_OTHER)
Admission: EM | Admit: 2024-04-24 | Discharge: 2024-04-24 | Disposition: A | Discharge status: Home / Self Care | Payer: Self-pay | Attending: Emergency Medicine | Admitting: Emergency Medicine

## 2024-04-24 DIAGNOSIS — X500XXA Overexertion from strenuous movement or load, initial encounter: Secondary | ICD-10-CM | POA: Insufficient documentation

## 2024-04-24 DIAGNOSIS — R109 Unspecified abdominal pain: Secondary | ICD-10-CM | POA: Insufficient documentation

## 2024-04-24 DIAGNOSIS — M25551 Pain in right hip: Secondary | ICD-10-CM | POA: Insufficient documentation

## 2024-04-24 MED ORDER — KETOROLAC TROMETHAMINE 15 MG/ML IJ SOLN
15.0000 mg | Freq: Once | INTRAMUSCULAR | Status: AC
  Administered 2024-04-24: 15 mg via INTRAMUSCULAR
  Filled 2024-04-24: qty 1

## 2024-04-24 MED ORDER — CYCLOBENZAPRINE HCL 10 MG PO TABS: 10.0000 mg | ORAL_TABLET | Freq: Two times a day (BID) | ORAL | 0 refills | Status: DC | PRN

## 2024-04-24 MED ORDER — LIDOCAINE 5 % EX PTCH
1.0000 | MEDICATED_PATCH | CUTANEOUS | Status: DC
  Administered 2024-04-24: 1 via TRANSDERMAL
  Filled 2024-04-24: qty 1

## 2024-04-24 NOTE — ED Notes (Signed)
 Pt DC by other RN.SABRASABRA

## 2024-04-24 NOTE — Discharge Instructions (Signed)
 You were seen for your pulled muscle and hip pain in the emergency department.   At home, please take Tylenol  and ibuprofen for your pain. You may also take the oxycodone  we have prescribed you for any breakthrough pain that may have.  Do not take this before driving or operating heavy machinery.  Do not take this medication with alcohol.    Check your MyChart online for the results of any tests that had not resulted by the time you left the emergency department.   Follow-up with orthopedics in 1 to 2 weeks if your symptoms do not resolve.  Return immediately to the emergency department if you experience any of the following: Worsening pain, or any other concerning symptoms.    Thank you for visiting our Emergency Department. It was a pleasure taking care of you today.

## 2024-04-24 NOTE — ED Provider Notes (Signed)
 [place] EMERGENCY DEPARTMENT AT [facility] Provider Note   CSN: [id number] Arrival date & time: 04/24/24  1544     Patient presents with: Hip Pain and Leg Pain   Jamie Klein is a 53 y.o. female.  {Add pertinent medical, surgical, social history, OB history to HPI:8376} 53 year old patient-year-old female who presents emergency department with right hip pain.  Patient reports that on Friday she was lifting a patient when she felt a sharp pain in her right hip and flank.  Says that it was getting better until she reinjured it.  Does not radiate down her leg.  No bowel or bladder incontinence.  No leg weakness or numbness.  No history of surgery on her back or hip.  Not on any anticoagulation.  No history of cancer.  No history of IV drug use.       Prior to Admission medications   Medication Sig Start Date End Date Taking? Authorizing Provider  cefUROXime  (CEFTIN ) 500 MG tablet Take 1 tablet (500 mg total) by mouth 2 (two) times daily with a meal. 07/30/23   Keith Sor, PA-C  diclofenac  (VOLTAREN ) 50 MG EC tablet Take 1 tablet (50 mg total) by mouth 3 (three) times daily. 08/04/23   Clark, Meghan R, PA-C  furosemide (LASIX) 20 MG tablet Take 20 mg by mouth 2 (two) times daily.    [provider]  methocarbamol  (ROBAXIN ) 500 MG tablet Take 1 tablet (500 mg total) by mouth 2 (two) times daily. 08/04/23   Clark, Meghan R, PA-C  metroNIDAZOLE  (FLAGYL ) 500 MG tablet Take 1 tablet (500 mg total) by mouth 2 (two) times daily with a meal. DO NOT CONSUME ALCOHOL WHILE TAKING THIS MEDICATION. 09/25/23   Christopher Savannah, PA-C  Multiple Vitamins-Minerals (MULTIVITAMIN PO) Take by mouth daily.    [provider]  ondansetron  (ZOFRAN -ODT) 4 MG disintegrating tablet Take 1 tablet (4 mg total) by mouth every 8 (eight) hours as needed for nausea or vomiting. 07/30/23   Keith Sor, PA-C  oxyCODONE -acetaminophen  (PERCOCET/ROXICET) 5-325 MG tablet Take 1 tablet by mouth every 6  (six) hours as needed for severe pain. 08/04/23   Clark, Meghan R, PA-C  potassium chloride (K-DUR) 10 MEQ tablet Take 10 mEq by mouth 2 (two) times daily.    [provider]  QUEtiapine (SEROQUEL XR) 300 MG 24 hr tablet Take 300 mg by mouth at bedtime.    [provider]    Allergies: Patient has no known allergies.    Review of Systems  Updated Vital Signs BP 97/66 (BP Location: Left Arm)   Pulse 87   Temp 98.8 F (37.1 C) (Oral)   Resp 14   LMP 12/22/2013   SpO2 100%   Physical Exam Vitals and nursing note reviewed.  Constitutional:      General: She is not in acute distress.    Appearance: She is well-developed.  HENT:     Head: Normocephalic and atraumatic.     Right Ear: External ear normal.     Left Ear: External ear normal.     Nose: Nose normal.  Eyes:     Extraocular Movements: Extraocular movements intact.     Conjunctiva/sclera: Conjunctivae normal.     Pupils: Pupils are equal, round, and reactive to light.  Abdominal:     General: Abdomen is flat. There is no distension.     Palpations: Abdomen is soft. There is no mass.     Tenderness: There is no abdominal tenderness. There is  no right CVA tenderness, left CVA tenderness or guarding.  Musculoskeletal:     Right lower leg: No edema.     Left lower leg: No edema.     Comments: Tenderness palpation along the iliac crest of the right hip.  No midline lumbar spine tenderness palpation or step-offs.  No tenderness palpation of the right hip joint itself.  Full range of motion of the right hip joint.  No effusion noted of the right hip.  Skin:    General: Skin is warm and dry.  Neurological:     Mental Status: She is alert and oriented to person, place, and time. Mental status is at baseline.  Psychiatric:        Mood and Affect: Mood normal.     (all labs ordered are listed, but only abnormal results are displayed) Labs Reviewed - No data to display  EKG: None  Radiology: No results  found.  {Document cardiac monitor, telemetry assessment procedure when appropriate:32947} Procedures   Medications Ordered in the ED - No data to display    {Click here for ABCD2, HEART and other calculators REFRESH Note before signing:1}                              Medical Decision Making Amount and/or Complexity of Data Reviewed Radiology: ordered.  Risk Prescription drug management.   ***  {Document critical care time when appropriate  Document review of labs and clinical decision tools ie CHADS2VASC2, etc  Document your independent review of radiology images and any outside records  Document your discussion with family members, caretakers and with consultants  Document social determinants of health affecting pt's care  Document your decision making why or why not admission, treatments were needed:32947:::1}   Final diagnoses:  None    ED Discharge Orders     None

## 2024-04-24 NOTE — ED Notes (Signed)
 Pt DC by MD before assessment could be performed.SABRASABRA

## 2024-04-24 NOTE — ED Triage Notes (Signed)
 Patient to ED POV reporting right sided hip pain shooting into her right leg. 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 Patient able to bear weight.

## 2024-07-10 ENCOUNTER — Ambulatory Visit: Payer: Self-pay

## 2024-07-12 ENCOUNTER — Telehealth: Payer: Self-pay

## 2024-07-12 ENCOUNTER — Telehealth: Payer: Self-pay | Admitting: Emergency Medicine

## 2024-07-12 DIAGNOSIS — M62838 Other muscle spasm: Secondary | ICD-10-CM | POA: Diagnosis not present

## 2024-07-12 MED ORDER — CYCLOBENZAPRINE HCL 10 MG PO TABS: 10.0000 mg | ORAL_TABLET | Freq: Three times a day (TID) | ORAL | 0 refills | Status: DC | PRN

## 2024-07-12 NOTE — Patient Instructions (Signed)
 Jamie Klein, thank you for joining Lamar Schlossman, PA-C for today's virtual visit.  While this provider is not your primary care provider (PCP), if your PCP is located in our provider database this encounter information will be shared with them immediately following your visit.   A [facility] MyChart account gives you access to today's visit and all your visits, tests, and labs performed at [facility]  click here if you don't have a [facility] MyChart account or go to [email or web address]  Consent: (Patient) Jamie Klein provided verbal consent for this virtual visit at the beginning of the encounter.  Current Medications:  Current Outpatient Medications:    cyclobenzaprine  (FLEXERIL ) 10 MG tablet, Take 1 tablet (10 mg total) by mouth 3 (three) times daily as needed for muscle spasms., Disp: 15 tablet, Rfl: 0   cefUROXime  (CEFTIN ) 500 MG tablet, Take 1 tablet (500 mg total) by mouth 2 (two) times daily with a meal., Disp: 14 tablet, Rfl: 0   cyclobenzaprine  (FLEXERIL ) 10 MG tablet, Take 1 tablet (10 mg total) by mouth 2 (two) times daily as needed for muscle spasms., Disp: 20 tablet, Rfl: 0   diclofenac  (VOLTAREN ) 50 MG EC tablet, Take 1 tablet (50 mg total) by mouth 3 (three) times daily., Disp: 30 tablet, Rfl: 0   furosemide (LASIX) 20 MG tablet, Take 20 mg by mouth 2 (two) times daily., Disp: , Rfl:    methocarbamol  (ROBAXIN ) 500 MG tablet, Take 1 tablet (500 mg total) by mouth 2 (two) times daily., Disp: 20 tablet, Rfl: 0   metroNIDAZOLE  (FLAGYL ) 500 MG tablet, Take 1 tablet (500 mg total) by mouth 2 (two) times daily with a meal. DO NOT CONSUME ALCOHOL WHILE TAKING THIS MEDICATION., Disp: 14 tablet, Rfl: 0   Multiple Vitamins-Minerals (MULTIVITAMIN PO), Take by mouth daily., Disp: , Rfl:    ondansetron  (ZOFRAN -ODT) 4 MG disintegrating tablet, Take 1 tablet (4 mg total) by mouth every 8 (eight) hours as needed for nausea or vomiting., Disp: 10 tablet, Rfl: 0    oxyCODONE -acetaminophen  (PERCOCET/ROXICET) 5-325 MG tablet, Take 1 tablet by mouth every 6 (six) hours as needed for severe pain., Disp: 10 tablet, Rfl: 0   potassium chloride (K-DUR) 10 MEQ tablet, Take 10 mEq by mouth 2 (two) times daily., Disp: , Rfl:    QUEtiapine (SEROQUEL XR) 300 MG 24 hr tablet, Take 300 mg by mouth at bedtime., Disp: , Rfl:    Medications ordered in this encounter:  Meds ordered this encounter  Medications   cyclobenzaprine  (FLEXERIL ) 10 MG tablet    Sig: Take 1 tablet (10 mg total) by mouth 3 (three) times daily as needed for muscle spasms.    Dispense:  15 tablet    Refill:  0    Supervising Provider:   LAMPTEY, PHILIP O [[id number]]     *If you need refills on other medications prior to your next appointment, please contact your pharmacy*  Follow-Up: Call back or seek an in-person evaluation if the symptoms worsen or if the condition fails to improve as anticipated.  [facility] [phone]  Other Instructions    If you have been instructed to have an in-person evaluation today at a local Urgent Care facility, please use the link below. It will take you to a list of all of our available [facility], including address, phone number and hours of operation. Please do not delay care.   Urgent Cares  If you or a family member  do not have a primary care provider, use the link below to schedule a visit and establish care. When you choose a [facility] primary care physician or advanced practice provider, you gain a long-term partner in health. Find a Primary Care Provider  Learn more about [facility]'s in-office and virtual care options:  - Get Care Now

## 2024-07-12 NOTE — Progress Notes (Signed)
 Virtual Visit Consent   Jamie Klein, you are scheduled for a virtual visit with a [facility] provider today. Just as with appointments in the office, your consent must be obtained to participate. Your consent will be active for this visit and any virtual visit you may have with one of our providers in the next 365 days. If you have a MyChart account, a copy of this consent can be sent to you electronically.  As this is a virtual visit, video technology does not allow for your provider to perform a traditional examination. This may limit your provider's ability to fully assess your condition. If your provider identifies any concerns that need to be evaluated in person or the need to arrange testing (such as labs, EKG, etc.), we will make arrangements to do so. Although advances in technology are sophisticated, we cannot ensure that it will always work on either your end or our end. If the connection with a video visit is poor, the visit may have to be switched to a telephone visit. With either a video or telephone visit, we are not always able to ensure that we have a secure connection.  By engaging in this virtual visit, you consent to the provision of healthcare and authorize for your insurance to be billed (if applicable) for the services provided during this visit. Depending on your insurance coverage, you may receive a charge related to this service.  I need to obtain your verbal consent now. Are you willing to proceed with your visit today? Jamie Klein has provided verbal consent on 07/12/2024 for a virtual visit (video or telephone). Lamar Schlossman, PA-C  Date: 07/12/2024 9:05 AM   Virtual Visit via Video Note   I, Lamar Schlossman, connected with  Jamie Klein  ([id number], [id number]) on 07/12/24 at  9:00 AM EDT by a video-enabled telemedicine application and verified that I am speaking with the correct person using two identifiers.  Location: Patient: Virtual Visit Location Patient:  Home Provider: Virtual Visit Location Provider: Home Office   I discussed the limitations of evaluation and management by telemedicine and the availability of in person appointments. The patient expressed understanding and agreed to proceed.    History of Present Illness: Jamie Klein is a 53 y.o. who identifies as a female who was assigned female at birth, and is being seen today for spasms and troubles sleeping.  States that she gets muscle spasms.  States that she has spasms in her legs.  This is not a new problem.  The spasms keep her from sleeping.  She states that she normally takes a muscle relaxer or seroquel but hasn't had any in a minute.  HPI: HPI  Problems:  Patient Active Problem List   Diagnosis Date Noted   Left shoulder pain 01/12/2014   History of tobacco use 01/12/2014   Atypical chest pain 01/12/2014   History of sexually transmitted disease 05/12/2011   History of cesarean delivery 05/12/2011   History of drug abuse (HCC) 05/12/2011    Allergies: No Known Allergies Medications:  Current Outpatient Medications:    cyclobenzaprine  (FLEXERIL ) 10 MG tablet, Take 1 tablet (10 mg total) by mouth 3 (three) times daily as needed for muscle spasms., Disp: 15 tablet, Rfl: 0   cefUROXime  (CEFTIN ) 500 MG tablet, Take 1 tablet (500 mg total) by mouth 2 (two) times daily with a meal., Disp: 14 tablet, Rfl: 0   cyclobenzaprine  (FLEXERIL ) 10 MG tablet, Take 1 tablet (10 mg total) by  mouth 2 (two) times daily as needed for muscle spasms., Disp: 20 tablet, Rfl: 0   diclofenac  (VOLTAREN ) 50 MG EC tablet, Take 1 tablet (50 mg total) by mouth 3 (three) times daily., Disp: 30 tablet, Rfl: 0   furosemide (LASIX) 20 MG tablet, Take 20 mg by mouth 2 (two) times daily., Disp: , Rfl:    methocarbamol  (ROBAXIN ) 500 MG tablet, Take 1 tablet (500 mg total) by mouth 2 (two) times daily., Disp: 20 tablet, Rfl: 0   metroNIDAZOLE  (FLAGYL ) 500 MG tablet, Take 1 tablet (500 mg total) by mouth 2 (two)  times daily with a meal. DO NOT CONSUME ALCOHOL WHILE TAKING THIS MEDICATION., Disp: 14 tablet, Rfl: 0   Multiple Vitamins-Minerals (MULTIVITAMIN PO), Take by mouth daily., Disp: , Rfl:    ondansetron  (ZOFRAN -ODT) 4 MG disintegrating tablet, Take 1 tablet (4 mg total) by mouth every 8 (eight) hours as needed for nausea or vomiting., Disp: 10 tablet, Rfl: 0   oxyCODONE -acetaminophen  (PERCOCET/ROXICET) 5-325 MG tablet, Take 1 tablet by mouth every 6 (six) hours as needed for severe pain., Disp: 10 tablet, Rfl: 0   potassium chloride (K-DUR) 10 MEQ tablet, Take 10 mEq by mouth 2 (two) times daily., Disp: , Rfl:    QUEtiapine (SEROQUEL XR) 300 MG 24 hr tablet, Take 300 mg by mouth at bedtime., Disp: , Rfl:   Observations/Objective: Patient is well-developed, well-nourished in no acute distress.  Resting comfortably at home.  Head is normocephalic, atraumatic.  No labored breathing.  Speech is clear and coherent with logical content.  Patient is alert and oriented at baseline.    Assessment and Plan: 1. Muscle spasms of both lower extremities (Primary)  Meds ordered this encounter  Medications   cyclobenzaprine  (FLEXERIL ) 10 MG tablet    Sig: Take 1 tablet (10 mg total) by mouth 3 (three) times daily as needed for muscle spasms.    Dispense:  15 tablet    Refill:  0    Supervising Provider:   BLAISE ALEENE KIDD [[id number]]     Follow Up Instructions: I discussed the assessment and treatment plan with the patient. The patient was provided an opportunity to ask questions and all were answered. The patient agreed with the plan and demonstrated an understanding of the instructions.  A copy of instructions were sent to the patient via MyChart unless otherwise noted below.    The patient was advised to call back or seek an in-person evaluation if the symptoms worsen or if the condition fails to improve as anticipated.    Lamar Schlossman, PA-C

## 2024-07-16 ENCOUNTER — Other Ambulatory Visit: Payer: Self-pay | Admitting: Internal Medicine

## 2024-07-16 DIAGNOSIS — Z1231 Encounter for screening mammogram for malignant neoplasm of breast: Secondary | ICD-10-CM

## 2024-07-22 DIAGNOSIS — Z1231 Encounter for screening mammogram for malignant neoplasm of breast: Secondary | ICD-10-CM

## 2024-09-30 ENCOUNTER — Other Ambulatory Visit: Payer: Self-pay | Admitting: Internal Medicine

## 2024-09-30 DIAGNOSIS — Z1231 Encounter for screening mammogram for malignant neoplasm of breast: Secondary | ICD-10-CM

## 2024-10-02 ENCOUNTER — Ambulatory Visit: Payer: Self-pay | Admitting: Podiatry

## 2024-10-03 ENCOUNTER — Ambulatory Visit: Payer: Self-pay | Admitting: Podiatry

## 2024-10-25 ENCOUNTER — Ambulatory Visit

## 2024-10-25 DIAGNOSIS — Z1231 Encounter for screening mammogram for malignant neoplasm of breast: Secondary | ICD-10-CM

## 2024-10-28 ENCOUNTER — Other Ambulatory Visit: Payer: Self-pay | Admitting: Internal Medicine

## 2024-10-28 DIAGNOSIS — Z1231 Encounter for screening mammogram for malignant neoplasm of breast: Secondary | ICD-10-CM

## 2024-11-04 ENCOUNTER — Other Ambulatory Visit: Payer: Self-pay

## 2024-11-04 ENCOUNTER — Ambulatory Visit

## 2024-11-04 ENCOUNTER — Telehealth

## 2024-11-04 ENCOUNTER — Ambulatory Visit
Admission: EM | Admit: 2024-11-04 | Discharge: 2024-11-04 | Disposition: A | Discharge status: Home / Self Care | Attending: Family Medicine | Admitting: Family Medicine

## 2024-11-04 DIAGNOSIS — Z202 Contact with and (suspected) exposure to infections with a predominantly sexual mode of transmission: Secondary | ICD-10-CM | POA: Diagnosis not present

## 2024-11-04 DIAGNOSIS — N3001 Acute cystitis with hematuria: Secondary | ICD-10-CM | POA: Diagnosis not present

## 2024-11-04 LAB — POCT URINE DIPSTICK
Bilirubin, UA: NEGATIVE
Glucose, UA: NEGATIVE mg/dL
Ketones, POC UA: NEGATIVE mg/dL
Leukocytes, UA: NEGATIVE
Nitrite, UA: POSITIVE — AB
POC PROTEIN,UA: NEGATIVE
Spec Grav, UA: 1.025
Urobilinogen, UA: 0.2 U/dL
pH, UA: 5.5

## 2024-11-04 MED ORDER — CEFDINIR 300 MG PO CAPS: 300.0000 mg | ORAL_CAPSULE | Freq: Two times a day (BID) | ORAL | 0 refills | Status: AC

## 2024-11-04 NOTE — ED Triage Notes (Signed)
 Pt presenting with c/o lower back, right flank pain and burning on urination x 2 days. No medication taken for symptoms. Pt is requesting STD testing including blood work for HIV.

## 2024-11-04 NOTE — Discharge Instructions (Addendum)
 Advised patient take medication as directed with food to completion.  Encouraged increase daily water intake to 64 ounces per day while taking his medication.  Advised we will follow-up with urine culture results once received.  Advised if symptoms worsen and/or unresolved please follow-up with your PCP or here for further evaluation.

## 2024-11-04 NOTE — ED Provider Notes (Signed)
 " [facility]    CSN: [id number] Arrival date & time: 11/04/24  1311      History   Chief Complaint Chief Complaint  Patient presents with   Dysuria    HPI Jamie Klein is a 54 y.o. female.   HPI 54 year old female presents with urinary and right flank pain for 2 days.  Patient request STD testing to include blood work as well today.  PMH significant for HTN and history of STD.  Past Medical History:  Diagnosis Date   Abnormal Pap smear    Anemia    Chest pain 11/15/2012   2D Echo - EF 55-60%, normal   Chronic back pain    Hypertension    Shortness of breath     Patient Active Problem List   Diagnosis Date Noted   Left shoulder pain 01/12/2014   History of tobacco use 01/12/2014   Atypical chest pain 01/12/2014   History of sexually transmitted disease 05/12/2011   History of cesarean delivery 05/12/2011   History of drug abuse (HCC) 05/12/2011    Past Surgical History:  Procedure Laterality Date   CESAREAN SECTION     x3   FOOT SURGERY Bilateral 2006, 2009   TUBAL LIGATION      OB History     Gravida  7   Para  4   Term  4   Preterm  0   AB  3   Living  2      SAB      IAB  3   Ectopic  0   Multiple  0   Live Births               Home Medications    Prior to Admission medications  Medication Sig Start Date End Date Taking? Authorizing Provider  cefdinir  (OMNICEF ) 300 MG capsule Take 1 capsule (300 mg total) by mouth 2 (two) times daily for 7 days. 11/04/24 11/11/24 Yes Teddy Sharper, FNP  diclofenac  (VOLTAREN ) 50 MG EC tablet Take 1 tablet (50 mg total) by mouth 3 (three) times daily. 08/04/23   Clark, Meghan R, PA-C  furosemide (LASIX) 20 MG tablet Take 20 mg by mouth 2 (two) times daily.    [provider]  methocarbamol  (ROBAXIN ) 500 MG tablet Take 1 tablet (500 mg total) by mouth 2 (two) times daily. 08/04/23   Clark, Meghan R, PA-C  metroNIDAZOLE  (FLAGYL ) 500 MG tablet Take 1 tablet (500 mg total) by  mouth 2 (two) times daily with a meal. DO NOT CONSUME ALCOHOL WHILE TAKING THIS MEDICATION. 09/25/23   Christopher Savannah, PA-C    Family History Family History  Problem Relation Age of Onset   CVA Father    HIV/AIDS Sister     Social History Social History[1]   Allergies   Patient has no known allergies.   Review of Systems Review of Systems  Genitourinary:  Positive for dysuria.  Musculoskeletal:  Positive for back pain.     Physical Exam Triage Vital Signs ED Triage Vitals  Encounter Vitals Group     BP      Girls Systolic BP Percentile      Girls Diastolic BP Percentile      Boys Systolic BP Percentile      Boys Diastolic BP Percentile      Pulse      Resp      Temp      Temp src      SpO2  Weight      Height      Head Circumference      Peak Flow      Pain Score      Pain Loc      Pain Education      Exclude from Growth Chart    No data found.  Updated Vital Signs BP 125/79 (BP Location: Right Arm)   Pulse 65   Temp 98 F (36.7 C) (Oral)   Resp 20   Ht 5' 2 (1.575 m)   Wt 160 lb (72.6 kg)   LMP 12/22/2013   SpO2 99%   BMI 29.26 kg/m   Visual Acuity Right Eye Distance:   Left Eye Distance:   Bilateral Distance:    Right Eye Near:   Left Eye Near:    Bilateral Near:     Physical Exam Vitals and nursing note reviewed.  Constitutional:      General: She is not in acute distress.    Appearance: Normal appearance. She is obese. She is not ill-appearing.  HENT:     Head: Normocephalic and atraumatic.     Mouth/Throat:     Mouth: Mucous membranes are moist.     Pharynx: Oropharynx is clear.  Eyes:     Extraocular Movements: Extraocular movements intact.     Conjunctiva/sclera: Conjunctivae normal.     Pupils: Pupils are equal, round, and reactive to light.  Cardiovascular:     Rate and Rhythm: Normal rate and regular rhythm.     Heart sounds: Normal heart sounds.  Pulmonary:     Effort: Pulmonary effort is normal.     Breath sounds:  Normal breath sounds. No wheezing, rhonchi or rales.  Abdominal:     Tenderness: There is no right CVA tenderness or left CVA tenderness.  Musculoskeletal:        General: Normal range of motion.  Skin:    General: Skin is warm and dry.  Neurological:     General: No focal deficit present.     Mental Status: She is alert and oriented to person, place, and time. Mental status is at baseline.  Psychiatric:        Mood and Affect: Mood normal.        Behavior: Behavior normal.      UC Treatments / Results  Labs (all labs ordered are listed, but only abnormal results are displayed) Labs Reviewed  POCT URINE DIPSTICK - Abnormal; Notable for the following components:      Result Value   Clarity, UA cloudy (*)    Blood, UA trace-intact (*)    Nitrite, UA Positive (*)    All other components within normal limits  URINE CULTURE  SYPHILIS: RPR W/REFLEX TO RPR TITER AND TREPONEMAL ANTIBODIES, TRADITIONAL SCREENING AND DIAGNOSIS ALGORITHM  HIV ANTIBODY (ROUTINE TESTING W REFLEX)  HEPATITIS C ANTIBODY  CERVICOVAGINAL ANCILLARY ONLY    EKG   Radiology No results found.  Procedures Procedures (including critical care time)  Medications Ordered in UC Medications - No data to display  Initial Impression / Assessment and Plan / UC Course  I have reviewed the triage vital signs and the nursing notes.  Pertinent labs & imaging results that were available during my care of the patient were reviewed by me and considered in my medical decision making (see chart for details).     MDM: 1.  Acute cystitis with hematuria-UA revealed above, urine culture ordered, Rx'd cefdinir  300 mg capsule: Take 1 capsule twice daily  x 7 days; 2.  Potential exposure to STD-Aptima swab ordered (GC chlamydia/trichomonas), RPR, hep C, HIV routine testing with reflexes ordered. Advised patient take medication as directed with food to completion.  Encouraged increase daily water intake to 64 ounces per day  while taking his medication.  Advised we will follow-up with urine culture results once received.  Advised if symptoms worsen and/or unresolved please follow-up with your PCP or here for further evaluation.  Patient discharged home, hemodynamically stable.  Work note provided to patient prior to discharge per request Final Clinical Impressions(s) / UC Diagnoses   Final diagnoses:  Acute cystitis with hematuria  Potential exposure to STD     Discharge Instructions      Advised patient take medication as directed with food to completion.  Encouraged increase daily water intake to 64 ounces per day while taking his medication.  Advised we will follow-up with urine culture results once received.  Advised if symptoms worsen and/or unresolved please follow-up with your PCP or here for further evaluation.     ED Prescriptions     Medication Sig Dispense Auth. Provider   cefdinir  (OMNICEF ) 300 MG capsule Take 1 capsule (300 mg total) by mouth 2 (two) times daily for 7 days. 14 capsule Tynetta Bachmann, FNP      PDMP not reviewed this encounter.    [1]  Social History Tobacco Use   Smoking status: Some Days    Current packs/day: 0.30    Types: Cigarettes   Smokeless tobacco: Never  Vaping Use   Vaping status: Never Used  Substance Use Topics   Alcohol use: No   Drug use: Yes    Types: Marijuana     Teddy Sharper, FNP 11/04/24 1448  "

## 2024-11-05 LAB — HIV ANTIBODY (ROUTINE TESTING W REFLEX): HIV Screen 4th Generation wRfx: NONREACTIVE

## 2024-11-05 LAB — HEPATITIS C ANTIBODY: Hep C Virus Ab: NONREACTIVE

## 2024-11-05 LAB — SYPHILIS: RPR W/REFLEX TO RPR TITER AND TREPONEMAL ANTIBODIES, TRADITIONAL SCREENING AND DIAGNOSIS ALGORITHM: RPR Ser Ql: NONREACTIVE

## 2024-11-06 ENCOUNTER — Telehealth: Payer: Self-pay | Admitting: Physician Assistant

## 2024-11-06 ENCOUNTER — Ambulatory Visit (HOSPITAL_COMMUNITY): Payer: Self-pay

## 2024-11-06 ENCOUNTER — Telehealth

## 2024-11-06 ENCOUNTER — Encounter: Admitting: Physician Assistant

## 2024-11-06 DIAGNOSIS — R300 Dysuria: Secondary | ICD-10-CM | POA: Diagnosis not present

## 2024-11-06 LAB — CERVICOVAGINAL ANCILLARY ONLY
Bacterial Vaginitis (gardnerella): POSITIVE — AB
Candida Glabrata: NEGATIVE
Candida Vaginitis: POSITIVE — AB
Chlamydia: NEGATIVE
Comment: NEGATIVE
Comment: NEGATIVE
Comment: NEGATIVE
Comment: NEGATIVE
Comment: NEGATIVE
Comment: NORMAL
Neisseria Gonorrhea: NEGATIVE
Trichomonas: NEGATIVE

## 2024-11-06 LAB — URINE CULTURE: Culture: >=100000 — AB

## 2024-11-06 MED ORDER — METRONIDAZOLE 0.75 % VA GEL: 1.0000 | Freq: Every day | VAGINAL | 0 refills | Status: AC

## 2024-11-06 MED ORDER — PHENAZOPYRIDINE HCL 100 MG PO TABS: 100.0000 mg | ORAL_TABLET | Freq: Three times a day (TID) | ORAL | 0 refills | Status: AC | PRN

## 2024-11-06 MED ORDER — FLUCONAZOLE 150 MG PO TABS: 150.0000 mg | ORAL_TABLET | Freq: Once | ORAL | 0 refills | Status: AC

## 2024-11-06 MED ORDER — METRONIDAZOLE 500 MG PO TABS: 500.0000 mg | ORAL_TABLET | Freq: Two times a day (BID) | ORAL | 0 refills | Status: AC

## 2024-11-06 NOTE — Progress Notes (Signed)
 Duplicate.   Completed an EV already.   This encounter was created in error - please disregard.

## 2024-11-06 NOTE — Progress Notes (Signed)
 We are sorry that you are not feeling well.  Here is how we plan to help!  Based on what you shared with me it looks like you most likely have a simple urinary tract infection.  A UTI (Urinary Tract Infection) is a bacterial infection of the bladder.  Most cases of urinary tract infections are simple to treat but a key part of your care is to encourage you to drink plenty of fluids and watch your symptoms carefully.  I have prescribed Pyridium  100mg  Take 1-2 capsules every 8 hours as needed for pain.  Your symptoms should gradually improve. Call us  if the burning in your urine worsens, you develop worsening fever, back pain or pelvic pain or if your symptoms do not resolve after completing the antibiotic.  Urinary tract infections can be prevented by drinking plenty of water to keep your body hydrated.  Also be sure when you wipe, wipe from front to back and don't hold it in!  If possible, empty your bladder every 4 hours.  Your e-visit answers were reviewed by a board certified advanced clinical practitioner to complete your personal care plan.  Depending on the condition, your plan could have included both over the counter or prescription medications.  If there is a problem please reply  once you have received a response from your provider.  Your safety is important to us .  If you have drug allergies check your prescription carefully.    You can use MyChart to ask questions about todays visit, request a non-urgent call back, or ask for a work or school excuse for 24 hours related to this e-Visit. If it has been greater than 24 hours you will need to follow up with your provider, or enter a new e-Visit to address those concerns.   You will get an e-mail in the next two days asking about your experience.  I hope that your e-visit has been valuable and will speed your recovery. Thank you for using e-visits.  I have spent 5 minutes in review of e-visit questionnaire, review and updating patient  chart, medical decision making and response to patient.   Delon CHRISTELLA Dickinson, PA-C

## 2024-11-07 ENCOUNTER — Ambulatory Visit
Admission: EM | Admit: 2024-11-07 | Discharge: 2024-11-07 | Disposition: A | Discharge status: Home / Self Care | Attending: Family Medicine | Admitting: Family Medicine

## 2024-11-07 DIAGNOSIS — Z0289 Encounter for other administrative examinations: Secondary | ICD-10-CM

## 2024-11-07 NOTE — ED Provider Notes (Signed)
 Patient came in and registered, then told the nurse she did not need to be seen by physician only needed work note.  Work note was provided for her   Jamie Jamee Jacob, MD 11/07/24 1304

## 2024-11-07 NOTE — Discharge Instructions (Signed)
 Here for work note

## 2024-11-07 NOTE — ED Notes (Signed)
 Pt needed work note signed for her job

## 2024-11-12 ENCOUNTER — Ambulatory Visit

## 2024-11-12 DIAGNOSIS — Z1231 Encounter for screening mammogram for malignant neoplasm of breast: Secondary | ICD-10-CM
